# Patient Record
Sex: Female | Born: 1975 | Race: Black or African American | Hispanic: No | Marital: Single | Smoking: Current every day smoker
Health system: Southern US, Community
[De-identification: ages and names within clinical notes are randomized; demographics above are authoritative.]

## PROBLEM LIST (undated history)

## (undated) DIAGNOSIS — R102 Pelvic and perineal pain: Secondary | ICD-10-CM

## (undated) DIAGNOSIS — N946 Dysmenorrhea, unspecified: Secondary | ICD-10-CM

## (undated) DIAGNOSIS — F419 Anxiety disorder, unspecified: Secondary | ICD-10-CM

## (undated) DIAGNOSIS — D219 Benign neoplasm of connective and other soft tissue, unspecified: Secondary | ICD-10-CM

## (undated) HISTORY — PX: HERNIA REPAIR: SHX51

## (undated) HISTORY — DX: Pelvic and perineal pain: R10.2

## (undated) HISTORY — DX: Dysmenorrhea, unspecified: N94.6

## (undated) HISTORY — DX: Benign neoplasm of connective and other soft tissue, unspecified: D21.9

## (undated) HISTORY — DX: Anxiety disorder, unspecified: F41.9

## (undated) HISTORY — PX: TUBAL LIGATION: SHX77

---

## 2004-03-27 ENCOUNTER — Emergency Department: Payer: Self-pay | Admitting: Emergency Medicine

## 2004-06-12 ENCOUNTER — Emergency Department: Payer: Self-pay | Admitting: Emergency Medicine

## 2005-01-14 ENCOUNTER — Emergency Department: Payer: Self-pay | Admitting: Internal Medicine

## 2005-01-25 ENCOUNTER — Emergency Department: Payer: Self-pay | Admitting: Emergency Medicine

## 2005-06-24 ENCOUNTER — Emergency Department: Payer: Self-pay | Admitting: Emergency Medicine

## 2006-08-02 ENCOUNTER — Emergency Department: Payer: Self-pay | Admitting: General Practice

## 2006-10-03 ENCOUNTER — Inpatient Hospital Stay: Payer: Self-pay | Admitting: Vascular Surgery

## 2007-10-04 ENCOUNTER — Ambulatory Visit: Payer: Self-pay | Admitting: Certified Nurse Midwife

## 2007-12-08 ENCOUNTER — Emergency Department: Payer: Self-pay | Admitting: Emergency Medicine

## 2008-07-03 ENCOUNTER — Emergency Department: Payer: Self-pay | Admitting: Emergency Medicine

## 2008-09-04 ENCOUNTER — Ambulatory Visit: Payer: Self-pay

## 2008-09-11 ENCOUNTER — Ambulatory Visit: Payer: Self-pay

## 2008-09-22 ENCOUNTER — Emergency Department: Payer: Self-pay | Admitting: Unknown Physician Specialty

## 2009-04-19 ENCOUNTER — Emergency Department: Payer: Self-pay | Admitting: Unknown Physician Specialty

## 2010-01-17 ENCOUNTER — Emergency Department: Payer: Self-pay | Admitting: Emergency Medicine

## 2010-04-19 ENCOUNTER — Emergency Department: Payer: Self-pay | Admitting: Emergency Medicine

## 2011-03-26 ENCOUNTER — Emergency Department: Payer: Self-pay | Admitting: Emergency Medicine

## 2011-04-01 ENCOUNTER — Emergency Department: Payer: Self-pay | Admitting: Emergency Medicine

## 2011-06-10 ENCOUNTER — Emergency Department: Payer: Self-pay | Admitting: Emergency Medicine

## 2011-11-21 ENCOUNTER — Emergency Department: Payer: Self-pay | Admitting: *Deleted

## 2012-01-12 ENCOUNTER — Emergency Department: Payer: Self-pay | Admitting: Emergency Medicine

## 2012-01-26 ENCOUNTER — Ambulatory Visit: Payer: Self-pay | Admitting: Internal Medicine

## 2012-07-24 ENCOUNTER — Emergency Department: Payer: Self-pay | Admitting: Emergency Medicine

## 2012-07-24 LAB — URINALYSIS, COMPLETE
Bilirubin,UR: NEGATIVE
Blood: NEGATIVE
Nitrite: NEGATIVE
Protein: NEGATIVE
RBC,UR: 1 /HPF (ref 0–5)
Specific Gravity: 1.024 (ref 1.003–1.030)
WBC UR: 4 /HPF (ref 0–5)

## 2012-07-24 LAB — COMPREHENSIVE METABOLIC PANEL
Albumin: 3.7 g/dL (ref 3.4–5.0)
Anion Gap: 3 — ABNORMAL LOW (ref 7–16)
BUN: 11 mg/dL (ref 7–18)
Bilirubin,Total: 0.3 mg/dL (ref 0.2–1.0)
Chloride: 110 mmol/L — ABNORMAL HIGH (ref 98–107)
Co2: 25 mmol/L (ref 21–32)
Creatinine: 0.76 mg/dL (ref 0.60–1.30)
EGFR (Non-African Amer.): >60
Glucose: 93 mg/dL (ref 65–99)
SGOT(AST): 13 U/L — ABNORMAL LOW (ref 15–37)
Sodium: 138 mmol/L (ref 136–145)
Total Protein: 7.3 g/dL (ref 6.4–8.2)

## 2012-07-24 LAB — CBC
HCT: 39.5 % (ref 35.0–47.0)
MCH: 26 pg (ref 26.0–34.0)
MCV: 78 fL — ABNORMAL LOW (ref 80–100)
RBC: 5.04 10*6/uL (ref 3.80–5.20)
RDW: 14.5 % (ref 11.5–14.5)
WBC: 8.4 10*3/uL (ref 3.6–11.0)

## 2012-07-24 LAB — LIPASE, BLOOD: Lipase: 291 U/L (ref 73–393)

## 2012-07-24 LAB — WET PREP, GENITAL

## 2012-11-01 ENCOUNTER — Emergency Department: Payer: Self-pay | Admitting: Internal Medicine

## 2012-11-01 LAB — COMPREHENSIVE METABOLIC PANEL
Albumin: 3.9 g/dL (ref 3.4–5.0)
Alkaline Phosphatase: 72 U/L (ref 50–136)
Anion Gap: 6 — ABNORMAL LOW (ref 7–16)
BUN: 9 mg/dL (ref 7–18)
Bilirubin,Total: 0.4 mg/dL (ref 0.2–1.0)
Creatinine: 0.78 mg/dL (ref 0.60–1.30)
Osmolality: 274 (ref 275–301)
Potassium: 3.6 mmol/L (ref 3.5–5.1)
SGOT(AST): 18 U/L (ref 15–37)
SGPT (ALT): 21 U/L (ref 12–78)
Sodium: 138 mmol/L (ref 136–145)
Total Protein: 8 g/dL (ref 6.4–8.2)

## 2012-11-01 LAB — LIPASE, BLOOD: Lipase: 240 U/L (ref 73–393)

## 2012-11-01 LAB — CBC
HCT: 39.5 % (ref 35.0–47.0)
HGB: 13.6 g/dL (ref 12.0–16.0)
MCH: 26.4 pg (ref 26.0–34.0)
MCHC: 34.3 g/dL (ref 32.0–36.0)
MCV: 77 fL — ABNORMAL LOW (ref 80–100)
Platelet: 306 10*3/uL (ref 150–440)
RDW: 14 % (ref 11.5–14.5)
WBC: 7.1 10*3/uL (ref 3.6–11.0)

## 2012-11-01 LAB — URINALYSIS, COMPLETE
Bilirubin,UR: NEGATIVE
Ketone: NEGATIVE
Nitrite: NEGATIVE
RBC,UR: 2 /HPF (ref 0–5)
Specific Gravity: 1.021 (ref 1.003–1.030)
Squamous Epithelial: 26

## 2013-01-02 ENCOUNTER — Emergency Department: Payer: Self-pay | Admitting: Emergency Medicine

## 2013-03-27 ENCOUNTER — Emergency Department: Payer: Self-pay | Admitting: Emergency Medicine

## 2013-03-29 LAB — BETA STREP CULTURE(ARMC)

## 2013-08-09 ENCOUNTER — Emergency Department: Payer: Self-pay

## 2013-11-08 ENCOUNTER — Emergency Department: Payer: Self-pay | Admitting: Emergency Medicine

## 2013-11-08 LAB — URINALYSIS, COMPLETE
BILIRUBIN, UR: NEGATIVE
Bacteria: NONE SEEN
Blood: NEGATIVE
GLUCOSE, UR: NEGATIVE mg/dL (ref 0–75)
Ketone: NEGATIVE
LEUKOCYTE ESTERASE: NEGATIVE
Nitrite: NEGATIVE
Ph: 8 (ref 4.5–8.0)
Protein: NEGATIVE
RBC,UR: 1 /HPF (ref 0–5)
Specific Gravity: 1.013 (ref 1.003–1.030)
WBC UR: 2 /HPF (ref 0–5)

## 2014-05-12 ENCOUNTER — Emergency Department: Payer: Self-pay | Admitting: Emergency Medicine

## 2014-10-24 ENCOUNTER — Encounter: Payer: Self-pay | Admitting: Emergency Medicine

## 2014-10-24 ENCOUNTER — Emergency Department
Admission: EM | Admit: 2014-10-24 | Discharge: 2014-10-24 | Disposition: A | Discharge status: Home / Self Care | Payer: BLUE CROSS/BLUE SHIELD | Attending: Emergency Medicine | Admitting: Emergency Medicine

## 2014-10-24 DIAGNOSIS — M25531 Pain in right wrist: Secondary | ICD-10-CM | POA: Diagnosis not present

## 2014-10-24 DIAGNOSIS — Z720 Tobacco use: Secondary | ICD-10-CM | POA: Diagnosis not present

## 2014-10-24 MED ORDER — NAPROXEN 500 MG PO TABS: 500.0000 mg | ORAL_TABLET | Freq: Two times a day (BID) | ORAL | Status: DC

## 2014-10-24 MED ORDER — PREDNISONE 50 MG PO TABS: 50.0000 mg | ORAL_TABLET | Freq: Every day | ORAL | Status: DC

## 2014-10-24 NOTE — Discharge Instructions (Signed)

## 2014-10-24 NOTE — ED Notes (Signed)
Pt here with c/o right wrist pain, states "it looks like I got a little knot right there"; pt reports pain to top of hand, reports shooting pain up and down arm; some swelling note to forearm; denies recent injury.

## 2014-10-24 NOTE — ED Provider Notes (Signed)
[facility] Emergency Department Provider Note  ____________________________________________  Time seen: On arrival  I have reviewed the triage vital signs and the nursing notes.   HISTORY  Chief Complaint Wrist Pain    HPI Natasha Wolfe is a 39 y.o. female who presents with complaints of right wrist pain.She reports she woke with this pain. No injuries reported. No fevers no chills. No redness. 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 No numbness or tingling in her fingers. She reports she works with her hands at work is right-handed. She reports the pain is moderate to severe with movement of her right hand.    History reviewed. No pertinent past medical history.  There are no active problems to display for this patient.   Past Surgical History  Procedure Laterality Date  . Tubal ligation    . Hernia repair      No current outpatient prescriptions on file.  Allergies Review of patient's allergies indicates no known allergies.  No family history on file.  Social History History  Substance Use Topics  . Smoking status: Current Every Day Smoker -- 0.50 packs/day    Types: Cigarettes  . Smokeless tobacco: Not on file  . Alcohol Use: No    Review of Systems  Constitutional: Negative for fever. Eyes: Negative for visual changes. ENT: Negative for sore throat   Genitourinary: Negative for dysuria. Musculoskeletal: Negative for back pain. Skin: Negative for rash. Neurological: Negative for headaches or focal weakness   ____________________________________________   PHYSICAL EXAM:  VITAL SIGNS: ED Triage Vitals  Enc Vitals Group     BP 10/24/14 1126 121/80 mmHg     Pulse Rate 10/24/14 1126 82     Resp 10/24/14 1126 16     Temp 10/24/14 1126 98.2 F (36.8 C)     Temp Source 10/24/14 1126 Oral     SpO2 10/24/14 1126 100 %     Weight  10/24/14 1126 202 lb (91.627 kg)     Height 10/24/14 1126 5\' 3"  (1.6 m)     Head Cir --      Peak Flow --      Pain Score 10/24/14 1128 10     Pain Loc --      Pain Edu? --      Excl. in [place]? --      Constitutional: Alert and oriented. Well appearing and in no distress. Eyes: Conjunctivae are normal.  ENT   Head: Normocephalic and atraumatic.   Mouth/Throat: Mucous membranes are moist. Cardiovascular: Normal rate, regular rhythm.  Respiratory: Normal respiratory effort without tachypnea nor retractions.  Gastrointestinal: Soft and non-tender in all quadrants. No distention. There is no CVA tenderness. Musculoskeletal: All external and is normal except right wrist with mild swelling, no erythema. Pain with active or passive range of motion. Normal cap refill distally. Normal pulses. No evidence of infection. Neurologic:  Normal speech and language. No gross focal neurologic deficits are appreciated. Skin:  Skin is warm, dry and intact. No rash noted. Psychiatric: Mood and affect are normal. Patient exhibits appropriate insight and judgment.  ____________________________________________    LABS (pertinent positives/negatives)  Labs Reviewed - No data to display  ____________________________________________     ____________________________________________    RADIOLOGY I have personally reviewed any xrays that were ordered on this patient: None  ____________________________________________   PROCEDURES  Procedure(s) performed: none  ____________________________________________   INITIAL IMPRESSION / ASSESSMENT AND PLAN / ED COURSE  Pertinent labs & imaging results that were available during my care of the patient were reviewed by me and considered in my medical decision making (see chart for details).  Differential diagnosis includes arthritis, rheumatoid arthritis, arthralgia, gout. We will treat with NSAIDs brief course of prednisone and have patient  follow-up with orthopedist. She may need to consider rheumatology if this does not improve.  ____________________________________________   FINAL CLINICAL IMPRESSION(S) / ED DIAGNOSES  Final diagnoses:  Wrist pain, acute, right      Lavonia Drafts, MD 10/24/14 1142

## 2015-08-10 ENCOUNTER — Emergency Department
Admission: EM | Admit: 2015-08-10 | Discharge: 2015-08-10 | Disposition: A | Discharge status: Home / Self Care | Payer: BLUE CROSS/BLUE SHIELD | Attending: Emergency Medicine | Admitting: Emergency Medicine

## 2015-08-10 ENCOUNTER — Emergency Department: Payer: BLUE CROSS/BLUE SHIELD

## 2015-08-10 DIAGNOSIS — S60021A Contusion of right index finger without damage to nail, initial encounter: Secondary | ICD-10-CM | POA: Diagnosis not present

## 2015-08-10 DIAGNOSIS — Y999 Unspecified external cause status: Secondary | ICD-10-CM | POA: Diagnosis not present

## 2015-08-10 DIAGNOSIS — S6991XA Unspecified injury of right wrist, hand and finger(s), initial encounter: Secondary | ICD-10-CM | POA: Diagnosis present

## 2015-08-10 DIAGNOSIS — W230XXA Caught, crushed, jammed, or pinched between moving objects, initial encounter: Secondary | ICD-10-CM | POA: Insufficient documentation

## 2015-08-10 DIAGNOSIS — Y939 Activity, unspecified: Secondary | ICD-10-CM | POA: Diagnosis not present

## 2015-08-10 DIAGNOSIS — F17210 Nicotine dependence, cigarettes, uncomplicated: Secondary | ICD-10-CM | POA: Insufficient documentation

## 2015-08-10 DIAGNOSIS — S6000XA Contusion of unspecified finger without damage to nail, initial encounter: Secondary | ICD-10-CM

## 2015-08-10 DIAGNOSIS — Y929 Unspecified place or not applicable: Secondary | ICD-10-CM | POA: Insufficient documentation

## 2015-08-10 MED ORDER — HYDROCODONE-ACETAMINOPHEN 5-325 MG PO TABS: 1.0000 | ORAL_TABLET | ORAL | Status: DC | PRN

## 2015-08-10 MED ORDER — IBUPROFEN 800 MG PO TABS: 800.0000 mg | ORAL_TABLET | Freq: Three times a day (TID) | ORAL | Status: DC | PRN

## 2015-08-10 NOTE — ED Notes (Signed)
Right index finger swollen and tender states she shut in door this am

## 2015-08-10 NOTE — ED Notes (Signed)
Pt reports shutting right index finger in car door today.

## 2015-08-10 NOTE — Discharge Instructions (Signed)
Cryotherapy °Cryotherapy means treatment with cold. Ice or gel packs can be used to reduce both pain and swelling. Ice is the most helpful within the first 24 to 48 hours after an injury or flare-up from overusing a muscle or joint. Sprains, strains, spasms, burning pain, shooting pain, and aches can all be eased with ice. Ice can also be used when recovering from surgery. Ice is effective, has very few side effects, and is safe for most people to use. °PRECAUTIONS  °Ice is not a safe treatment option for people with: °· Raynaud phenomenon. This is a condition affecting small blood vessels in the extremities. Exposure to cold may cause your problems to return. °· Cold hypersensitivity. There are many forms of cold hypersensitivity, including: °· Cold urticaria. Red, itchy hives appear on the skin when the tissues begin to warm after being iced. °· Cold erythema. This is a red, itchy rash caused by exposure to cold. °· Cold hemoglobinuria. Red blood cells break down when the tissues begin to warm after being iced. The hemoglobin that carry oxygen are passed into the urine because they cannot combine with blood proteins fast enough. °· Numbness or altered sensitivity in the area being iced. °If you have any of the following conditions, do not use ice until you have discussed cryotherapy with your caregiver: °· Heart conditions, such as arrhythmia, angina, or chronic heart disease. °· High blood pressure. °· Healing wounds or open skin in the area being iced. °· Current infections. °· Rheumatoid arthritis. °· Poor circulation. °· Diabetes. °Ice slows the blood flow in the region it is applied. This is beneficial when trying to stop inflamed tissues from spreading irritating chemicals to surrounding tissues. However, if you expose your skin to cold temperatures for too long or without the proper protection, you can damage your skin or nerves. Watch for signs of skin damage due to cold. °HOME CARE INSTRUCTIONS °Follow  these tips to use ice and cold packs safely. °· Place a dry or damp towel between the ice and skin. A damp towel will cool the skin more quickly, so you may need to shorten the time that the ice is used. °· For a more rapid response, add gentle compression to the ice. °· Ice for no more than 10 to 20 minutes at a time. The bonier the area you are icing, the less time it will take to get the benefits of ice. °· Check your skin after 5 minutes to make sure there are no signs of a poor response to cold or skin damage. °· Rest 20 minutes or more between uses. °· Once your skin is numb, you can end your treatment. You can test numbness by very lightly touching your skin. The touch should be so light that you do not see the skin dimple from the pressure of your fingertip. When using ice, most people will feel these normal sensations in this order: cold, burning, aching, and numbness. °· Do not use ice on someone who cannot communicate their responses to pain, such as small children or people with dementia. °HOW TO MAKE AN ICE PACK °Ice packs are the most common way to use ice therapy. Other methods include ice massage, ice baths, and cryosprays. Muscle creams that cause a cold, tingly feeling do not offer the same benefits that ice offers and should not be used as a substitute unless recommended by your caregiver. °To make an ice pack, do one of the following: °· Place crushed ice or a   bag of frozen vegetables in a sealable plastic bag. Squeeze out the excess air. Place this bag inside another plastic bag. Slide the bag into a pillowcase or place a damp towel between your skin and the bag.  Mix 3 parts water with 1 part rubbing alcohol. Freeze the mixture in a sealable plastic bag. When you remove the mixture from the freezer, it will be slushy. Squeeze out the excess air. Place this bag inside another plastic bag. Slide the bag into a pillowcase or place a damp towel between your skin and the bag. SEEK MEDICAL CARE  IF:  You develop white spots on your skin. This may give the skin a blotchy (mottled) appearance.  Your skin turns blue or pale.  Your skin becomes waxy or hard.  Your swelling gets worse. MAKE SURE YOU:   Understand these instructions.  Will watch your condition.  Will get help right away if you are not doing well or get worse.   This information is not intended to replace advice given to you by your health care provider. Make sure you discuss any questions you have with your health care provider.   Document Released: 11/22/2010 Document Revised: 04/18/2014 Document Reviewed: 11/22/2010 Elsevier Interactive Patient Education 2016 [place] A contusion is a deep bruise. Contusions are the result of a blunt injury to tissues and muscle fibers under the skin. The injury causes bleeding under the skin. The skin overlying the contusion may turn blue, purple, or yellow. Minor injuries will give you a painless contusion, but more severe contusions may stay painful and swollen for a few weeks.  CAUSES  This condition is usually caused by a blow, trauma, or direct force to an area of the body. SYMPTOMS  Symptoms of this condition include:  Swelling of the injured area.  Pain and tenderness in the injured area.  Discoloration. The area may have redness and then turn blue, purple, or yellow. DIAGNOSIS  This condition is diagnosed based on a physical exam and medical history. An X-ray, CT scan, or MRI may be needed to determine if there are any associated injuries, such as broken bones (fractures). TREATMENT  Specific treatment for this condition depends on what area of the body was injured. In general, the best treatment for a contusion is resting, icing, applying pressure to (compression), and elevating the injured area. This is often called the RICE strategy. Over-the-counter anti-inflammatory medicines may also be recommended for pain control.  HOME CARE INSTRUCTIONS     Rest the injured area.  If directed, apply ice to the injured area:  Put ice in a plastic bag.  Place a towel between your skin and the bag.  Leave the ice on for 20 minutes, 2-3 times per day.  If directed, apply light compression to the injured area using an elastic bandage. Make sure the bandage is not wrapped too tightly. Remove and reapply the bandage as directed by your health care provider.  If possible, raise (elevate) the injured area above the level of your heart while you are sitting or lying down.  Take over-the-counter and prescription medicines only as told by your health care provider. SEEK MEDICAL CARE IF:  Your symptoms do not improve after several days of treatment.  Your symptoms get worse.  You have difficulty moving the injured area. SEEK IMMEDIATE MEDICAL CARE IF:   You have severe pain.  You have numbness in a hand or foot.  Your hand or foot turns pale or cold.  This information is not intended to replace advice given to you by your health care provider. Make sure you discuss any questions you have with your health care provider. °  °Document Released: 01/05/2005 Document Revised: 12/17/2014 Document Reviewed: 08/13/2014 °Elsevier Interactive Patient Education ©2016 Elsevier Inc. ° °

## 2015-08-10 NOTE — ED Provider Notes (Signed)
[facility] Emergency Department Provider Note  ____________________________________________  Time seen: Approximately 10:29 AM  I have reviewed the triage vital signs and the nursing notes.   HISTORY  Chief Complaint Finger Injury    HPI Natasha Wolfe is a 40 y.o. female presents for evaluation of shutting her right index finger in a car door today. Complains of pain at the PIP joint.   No past medical history on file.  There are no active problems to display for this patient.   Past Surgical History  Procedure Laterality Date  . Tubal ligation    . Hernia repair      Current Outpatient Rx  Name  Route  Sig  Dispense  Refill  . HYDROcodone-acetaminophen (NORCO) 5-325 MG tablet   Oral   Take 1-2 tablets by mouth every 4 (four) hours as needed for moderate pain.   15 tablet   0   . ibuprofen (ADVIL,MOTRIN) 800 MG tablet   Oral   Take 1 tablet (800 mg total) by mouth every 8 (eight) hours as needed.   30 tablet   0     Allergies Review of patient's allergies indicates no known allergies.  No family history on file.  Social History Social History  Substance Use Topics  . Smoking status: Current Every Day Smoker -- 0.50 packs/day    Types: Cigarettes  . Smokeless tobacco: Not on file  . Alcohol Use: No    Review of Systems Constitutional: No fever/chills Eyes: No visual changes. ENT: No sore throat. Cardiovascular: Denies chest pain. Respiratory: Denies shortness of breath. Gastrointestinal: No abdominal pain.  No nausea, no vomiting.  No diarrhea.  No constipation. Genitourinary: Negative for dysuria. Musculoskeletal: Right index finger pain. Skin: Negative for rash. Neurological: Negative for headaches, focal weakness or numbness.  10-point ROS otherwise negative.  ____________________________________________   PHYSICAL EXAM:  VITAL SIGNS: ED Triage Vitals  Enc Vitals Group     BP 08/10/15 1008 126/88 mmHg   Pulse Rate 08/10/15 1008 78     Resp 08/10/15 1008 16     Temp 08/10/15 1008 98.4 F (36.9 C)     Temp Source 08/10/15 1008 Oral     SpO2 08/10/15 1008 100 %     Weight 08/10/15 1008 210 lb (95.255 kg)     Height 08/10/15 1008 5\' 4"  (1.626 m)     Head Cir --      Peak Flow --      Pain Score 08/10/15 1009 10     Pain Loc --      Pain Edu? --      Excl. in [place]? --     Constitutional: Alert and oriented. Well appearing and in no acute distress. Musculoskeletal: Positive right index finger pain with limited range of motion. Increased pain and swelling and tenderness at the PIP joint. Distally neurovascularly intact skin intact. Neurologic:  Normal speech and language. No gross focal neurologic deficits are appreciated. No gait instability. Skin:  Skin is warm, dry and intact. No rash noted. Psychiatric: Mood and affect are normal. Speech and behavior are normal.  ____________________________________________   LABS (all labs ordered are listed, but only abnormal results are displayed)  Labs Reviewed - No data to display ____________________________________________  RADIOLOGY  IMPRESSION: No acute abnormality. Old tiny avulsion from the volar plate of the base of the middle phalanx. ____________________________________________   PROCEDURES  Procedure(s) performed: None  Critical Care performed: No  ____________________________________________   INITIAL IMPRESSION / ASSESSMENT  AND PLAN / ED COURSE  Pertinent labs & imaging results that were available during my care of the patient were reviewed by me and considered in my medical decision making (see chart for details).  Acute finger contusion. Patient placed with a [facility] Rx given for ibuprofen and Vicodin as needed for severe pain. Patient follow-up with PCP or return to ER with any worsening symptomology. ____________________________________________   FINAL CLINICAL IMPRESSION(S) / ED DIAGNOSES  Final  diagnoses:  Finger contusion, initial encounter     This chart was dictated using voice recognition software/Dragon. Despite best efforts to proofread, errors can occur which can change the meaning. Any change was purely unintentional.   Arlyss Repress, PA-C 08/10/15 [place], MD 08/10/15 [phone]

## 2015-09-14 ENCOUNTER — Emergency Department
Admission: EM | Admit: 2015-09-14 | Discharge: 2015-09-14 | Disposition: A | Discharge status: Home / Self Care | Payer: BLUE CROSS/BLUE SHIELD | Attending: Emergency Medicine | Admitting: Emergency Medicine

## 2015-09-14 ENCOUNTER — Emergency Department: Payer: BLUE CROSS/BLUE SHIELD

## 2015-09-14 ENCOUNTER — Encounter: Payer: Self-pay | Admitting: Emergency Medicine

## 2015-09-14 DIAGNOSIS — J40 Bronchitis, not specified as acute or chronic: Secondary | ICD-10-CM | POA: Diagnosis not present

## 2015-09-14 DIAGNOSIS — Z79899 Other long term (current) drug therapy: Secondary | ICD-10-CM | POA: Insufficient documentation

## 2015-09-14 DIAGNOSIS — R110 Nausea: Secondary | ICD-10-CM

## 2015-09-14 DIAGNOSIS — Z791 Long term (current) use of non-steroidal anti-inflammatories (NSAID): Secondary | ICD-10-CM | POA: Diagnosis not present

## 2015-09-14 DIAGNOSIS — F17210 Nicotine dependence, cigarettes, uncomplicated: Secondary | ICD-10-CM | POA: Diagnosis not present

## 2015-09-14 LAB — COMPREHENSIVE METABOLIC PANEL
ALBUMIN: 4.2 g/dL (ref 3.5–5.0)
ALK PHOS: 67 U/L (ref 38–126)
ALT: 13 U/L — ABNORMAL LOW (ref 14–54)
ANION GAP: 8 (ref 5–15)
AST: 17 U/L (ref 15–41)
BUN: 8 mg/dL (ref 6–20)
CALCIUM: 9.2 mg/dL (ref 8.9–10.3)
CHLORIDE: 106 mmol/L (ref 101–111)
CO2: 24 mmol/L (ref 22–32)
Creatinine, Ser: 0.85 mg/dL (ref 0.44–1.00)
GFR calc Af Amer: >60 mL/min (ref >60)
GFR calc non Af Amer: >60 mL/min (ref >60)
GLUCOSE: 90 mg/dL (ref 65–99)
Potassium: 3.6 mmol/L (ref 3.5–5.1)
SODIUM: 138 mmol/L (ref 135–145)
Total Bilirubin: 0.5 mg/dL (ref 0.3–1.2)
Total Protein: 7.8 g/dL (ref 6.5–8.1)

## 2015-09-14 LAB — URINALYSIS COMPLETE WITH MICROSCOPIC (ARMC ONLY)
BILIRUBIN URINE: NEGATIVE
Bacteria, UA: NONE SEEN
GLUCOSE, UA: NEGATIVE mg/dL
HGB URINE DIPSTICK: NEGATIVE
KETONES UR: NEGATIVE mg/dL
LEUKOCYTES UA: NEGATIVE
NITRITE: NEGATIVE
Protein, ur: NEGATIVE mg/dL
RBC / HPF: NONE SEEN RBC/hpf (ref 0–5)
SPECIFIC GRAVITY, URINE: 1.017 (ref 1.005–1.030)
pH: 7 (ref 5.0–8.0)

## 2015-09-14 LAB — CBC
HCT: 39.2 % (ref 35.0–47.0)
Hemoglobin: 12.9 g/dL (ref 12.0–16.0)
MCH: 24.9 pg — ABNORMAL LOW (ref 26.0–34.0)
MCHC: 32.9 g/dL (ref 32.0–36.0)
MCV: 75.8 fL — ABNORMAL LOW (ref 80.0–100.0)
PLATELETS: 363 10*3/uL (ref 150–440)
RBC: 5.17 MIL/uL (ref 3.80–5.20)
RDW: 15 % — AB (ref 11.5–14.5)
WBC: 7.1 10*3/uL (ref 3.6–11.0)

## 2015-09-14 LAB — LIPASE, BLOOD: LIPASE: 37 U/L (ref 11–51)

## 2015-09-14 MED ORDER — PROMETHAZINE HCL 25 MG PO TABS: 25.0000 mg | ORAL_TABLET | Freq: Four times a day (QID) | ORAL | Status: DC | PRN

## 2015-09-14 MED ORDER — AZITHROMYCIN 250 MG PO TABS
ORAL_TABLET | ORAL | Status: AC
  Administered 2015-09-14: 500 mg via ORAL
  Filled 2015-09-14: qty 2

## 2015-09-14 MED ORDER — AZITHROMYCIN 250 MG PO TABS
500.0000 mg | ORAL_TABLET | Freq: Once | ORAL | Status: AC
  Administered 2015-09-14: 500 mg via ORAL

## 2015-09-14 MED ORDER — SODIUM CHLORIDE 0.9 % IV BOLUS (SEPSIS)
1000.0000 mL | Freq: Once | INTRAVENOUS | Status: AC
  Administered 2015-09-14: 1000 mL via INTRAVENOUS

## 2015-09-14 MED ORDER — ONDANSETRON HCL 4 MG/2ML IJ SOLN
4.0000 mg | Freq: Once | INTRAMUSCULAR | Status: AC | PRN
  Administered 2015-09-14: 4 mg via INTRAVENOUS
  Filled 2015-09-14: qty 2

## 2015-09-14 MED ORDER — AZITHROMYCIN 250 MG PO TABS: 250.0000 mg | ORAL_TABLET | Freq: Every day | ORAL | Status: DC

## 2015-09-14 MED ORDER — AZITHROMYCIN 250 MG PO TABS
ORAL_TABLET | ORAL | Status: AC
  Filled 2015-09-14: qty 1

## 2015-09-14 NOTE — ED Provider Notes (Signed)
[facility] Emergency Department Provider Note  Time seen: 4:33 PM  I have reviewed the triage vital signs and the nursing notes.   HISTORY  Chief Complaint Emesis    HPI Natasha Wolfe is a 40 y.o. female with no past medical history who presents to the emergency department with nausea, generalized weakness and cough. According to the patient for the past one week she has been experiencing sinus congestion along with his significant cough. She was prescribed an antibiotic last week by the nurse at her work, however the patient states she cannot afford to fill it as it costs $50. She states the sinus congestion has improved she continues to have a moderate cough. She took sinus/cold medication last night and since then she states she has been feeling nauseated with occasional lightheaded sensation. Denies any abdominal pain. Denies any chest pain. Denies any headache or fever. Describes her nausea is moderate, much improved after receiving Zofran in the emergency department.     History reviewed. No pertinent past medical history.  There are no active problems to display for this patient.   Past Surgical History  Procedure Laterality Date  . Tubal ligation    . Hernia repair      Current Outpatient Rx  Name  Route  Sig  Dispense  Refill  . HYDROcodone-acetaminophen (NORCO) 5-325 MG tablet   Oral   Take 1-2 tablets by mouth every 4 (four) hours as needed for moderate pain.   15 tablet   0   . ibuprofen (ADVIL,MOTRIN) 800 MG tablet   Oral   Take 1 tablet (800 mg total) by mouth every 8 (eight) hours as needed.   30 tablet   0     Allergies Review of patient's allergies indicates no known allergies.  No family history on file.  Social History Social History  Substance Use Topics  . Smoking status: Current Every Day Smoker -- 0.50 packs/day    Types: Cigarettes  . Smokeless tobacco: None  . Alcohol Use: No    Review of  Systems Constitutional: Negative for fever.Occasional lightheadedness Cardiovascular: Negative for chest pain. Respiratory: Negative for shortness of breath. Gastrointestinal: Negative for abdominal pain. Positive for nausea. Genitourinary: Negative for dysuria. Musculoskeletal: Negative for back pain. Neurological: Negative for headache 10-point ROS otherwise negative.  ____________________________________________   PHYSICAL EXAM:  VITAL SIGNS: ED Triage Vitals  Enc Vitals Group     BP 09/14/15 1457 119/79 mmHg     Pulse Rate 09/14/15 1457 100     Resp 09/14/15 1457 18     Temp 09/14/15 1457 98.7 F (37.1 C)     Temp Source 09/14/15 1457 Oral     SpO2 09/14/15 1457 99 %     Weight 09/14/15 1457 190 lb (86.183 kg)     Height 09/14/15 1457 5\' 3"  (1.6 m)     Head Cir --      Peak Flow --      Pain Score --      Pain Loc --      Pain Edu? --      Excl. in [place]? --     Constitutional: Alert and oriented. Well appearing and in no distress. Eyes: Normal exam ENT   Head: Normocephalic and atraumatic   Mouth/Throat: Mucous membranes are moist. Cardiovascular: Normal rate, regular rhythm. No murmur Respiratory: Normal respiratory effort without tachypnea nor retractions. Breath sounds are clear  Gastrointestinal: Soft and nontender. No distention.   Musculoskeletal: Nontender with normal  range of motion in all extremities.  Neurologic:  Normal speech and language. No gross focal neurologic deficits Skin:  Skin is warm, dry and intact.  Psychiatric: Mood and affect are normal. Speech and behavior are normal.  ____________________________________________   RADIOLOGY  Bronchitic changes on chest x-ray     INITIAL IMPRESSION / ASSESSMENT AND PLAN / ED COURSE  Pertinent labs & imaging results that were available during my care of the patient were reviewed by me and considered in my medical decision making (see chart for details).  Patient presents the emergency  department with cough, nausea, intermittent lightheadedness, ongoing over the past one week but worse recently. States the nausea began last night after taking over-the-counter cold medications. Patient feeling much better after receiving nausea medication in the emergency department. Patient has a normal exam, she does have a fairly frequent dry cough, no rales or rhonchi. We will obtain a chest x-ray to rule out focal infection. Her symptoms are suggestive of bronchitis given her very frequent dry cough. States the nausea typically occurs after coughing. Patient's labs are largely within normal limits. If chest x-ray is normal we will place the patient on Zithromax.  X-ray consistent with bronchitis. We will dose Zithromax in the emergency department and discharged with the same. We'll also discharge with Phenergan as needed for nausea. Patient states she is feeling much better after IV fluids.   ____________________________________________   FINAL CLINICAL IMPRESSION(S) / ED DIAGNOSES  Bronchitis Nausea   Harvest Dark, MD 09/14/15 1757

## 2015-09-14 NOTE — ED Notes (Signed)
Preg test = NEG

## 2015-09-14 NOTE — ED Notes (Signed)
Nausea and vomiting since last night. Denies abdominal pain. States took an over the counter sinus med and began after that.

## 2015-09-14 NOTE — Discharge Instructions (Signed)
Upper Respiratory Infection, Adult Most upper respiratory infections (URIs) are a viral infection of the air passages leading to the lungs. A URI affects the nose, throat, and upper air passages. The most common type of URI is nasopharyngitis and is typically referred to as "the common cold." URIs run their course and usually go away on their own. Most of the time, a URI does not require medical attention, but sometimes a bacterial infection in the upper airways can follow a viral infection. This is called a secondary infection. Sinus and middle ear infections are common types of secondary upper respiratory infections. Bacterial pneumonia can also complicate a URI. A URI can worsen asthma and chronic obstructive pulmonary disease (COPD). Sometimes, these complications can require emergency medical care and may be life threatening.  CAUSES Almost all URIs are caused by viruses. A virus is a type of germ and can spread from one person to another.  RISKS FACTORS You may be at risk for a URI if:   You smoke.   You have chronic heart or lung disease.  You have a weakened defense (immune) system.   You are very young or very old.   You have nasal allergies or asthma.  You work in crowded or poorly ventilated areas.  You work in health care facilities or schools. SIGNS AND SYMPTOMS  Symptoms typically develop 2-3 days after you come in contact with a cold virus. Most viral URIs last 7-10 days. However, viral URIs from the influenza virus (flu virus) can last 14-18 days and are typically more severe. Symptoms may include:   Runny or stuffy (congested) nose.   Sneezing.   Cough.   Sore throat.   Headache.   Fatigue.   Fever.   Loss of appetite.   Pain in your forehead, behind your eyes, and over your cheekbones (sinus pain).  Muscle aches.  DIAGNOSIS  Your health care provider may diagnose a URI by:  Physical exam.  Tests to check that your symptoms are not due to  another condition such as:  Strep throat.  Sinusitis.  Pneumonia.  Asthma. TREATMENT  A URI goes away on its own with time. It cannot be cured with medicines, but medicines may be prescribed or recommended to relieve symptoms. Medicines may help:  Reduce your fever.  Reduce your cough.  Relieve nasal congestion. HOME CARE INSTRUCTIONS   Take medicines only as directed by your health care provider.   Gargle warm saltwater or take cough drops to comfort your throat as directed by your health care provider.  Use a warm mist humidifier or inhale steam from a shower to increase air moisture. This may make it easier to breathe.  Drink enough fluid to keep your urine clear or pale yellow.   Eat soups and other clear broths and maintain good nutrition.   Rest as needed.   Return to work when your temperature has returned to normal or as your health care provider advises. You may need to stay home longer to avoid infecting others. You can also use a face mask and careful hand washing to prevent spread of the virus.  Increase the usage of your inhaler if you have asthma.   Do not use any tobacco products, including cigarettes, chewing tobacco, or electronic cigarettes. If you need help quitting, ask your health care provider. PREVENTION  The best way to protect yourself from getting a cold is to practice good hygiene.   Avoid oral or hand contact with people with cold  symptoms.   Wash your hands often if contact occurs.  There is no clear evidence that vitamin C, vitamin E, echinacea, or exercise reduces the chance of developing a cold. However, it is always recommended to get plenty of rest, exercise, and practice good nutrition.  SEEK MEDICAL CARE IF:   You are getting worse rather than better.   Your symptoms are not controlled by medicine.   You have chills.  You have worsening shortness of breath.  You have brown or red mucus.  You have yellow or brown nasal  discharge.  You have pain in your face, especially when you bend forward.  You have a fever.  You have swollen neck glands.  You have pain while swallowing.  You have white areas in the back of your throat. SEEK IMMEDIATE MEDICAL CARE IF:   You have severe or persistent:  Headache.  Ear pain.  Sinus pain.  Chest pain.  You have chronic lung disease and any of the following:  Wheezing.  Prolonged cough.  Coughing up blood.  A change in your usual mucus.  You have a stiff neck.  You have changes in your:  Vision.  Hearing.  Thinking.  Mood. MAKE SURE YOU:   Understand these instructions.  Will watch your condition.  Will get help right away if you are not doing well or get worse.   This information is not intended to replace advice given to you by your health care provider. Make sure you discuss any questions you have with your health care provider.   Document Released: 09/21/2000 Document Revised: 08/12/2014 Document Reviewed: 07/03/2013 Elsevier Interactive Patient Education 2016 Else[employer]  Nausea, Adult Nausea is the feeling that you have an upset stomach or have to vomit. Nausea by itself is not likely a serious concern, but it may be an early sign of more serious medical problems. As nausea gets worse, it can lead to vomiting. If vomiting develops, there is the risk of dehydration.  CAUSES   Viral infections.  Food poisoning.  Medicines.  Pregnancy.  Motion sickness.  Migraine headaches.  Emotional distress.  Severe pain from any source.  Alcohol intoxication. HOME CARE INSTRUCTIONS  Get plenty of rest.  Ask your caregiver about specific rehydration instructions.  Eat small amounts of food and sip liquids more often.  Take all medicines as told by your caregiver. SEEK MEDICAL CARE IF:  You have not improved after 2 days, or you get worse.  You have a headache. SEEK IMMEDIATE MEDICAL CARE IF:   You have a  fever.  You faint.  You keep vomiting or have blood in your vomit.  You are extremely weak or dehydrated.  You have dark or bloody stools.  You have severe chest or abdominal pain. MAKE SURE YOU:  Understand these instructions.  Will watch your condition.  Will get help right away if you are not doing well or get worse.   This information is not intended to replace advice given to you by your health care provider. Make sure you discuss any questions you have with your health care provider.   Document Released: 05/05/2004 Document Revised: 04/18/2014 Document Reviewed: 12/08/2010 Elsevier Interactive Patient Education [employer].

## 2015-09-17 ENCOUNTER — Emergency Department: Payer: BLUE CROSS/BLUE SHIELD

## 2015-09-17 ENCOUNTER — Emergency Department
Admission: EM | Admit: 2015-09-17 | Discharge: 2015-09-17 | Disposition: A | Discharge status: Home / Self Care | Payer: BLUE CROSS/BLUE SHIELD | Attending: Student | Admitting: Student

## 2015-09-17 ENCOUNTER — Encounter: Payer: Self-pay | Admitting: *Deleted

## 2015-09-17 DIAGNOSIS — R1111 Vomiting without nausea: Secondary | ICD-10-CM

## 2015-09-17 DIAGNOSIS — N390 Urinary tract infection, site not specified: Secondary | ICD-10-CM | POA: Insufficient documentation

## 2015-09-17 DIAGNOSIS — Z792 Long term (current) use of antibiotics: Secondary | ICD-10-CM | POA: Diagnosis not present

## 2015-09-17 DIAGNOSIS — J40 Bronchitis, not specified as acute or chronic: Secondary | ICD-10-CM | POA: Diagnosis not present

## 2015-09-17 DIAGNOSIS — F17210 Nicotine dependence, cigarettes, uncomplicated: Secondary | ICD-10-CM | POA: Insufficient documentation

## 2015-09-17 DIAGNOSIS — R112 Nausea with vomiting, unspecified: Secondary | ICD-10-CM | POA: Diagnosis present

## 2015-09-17 LAB — CBC WITH DIFFERENTIAL/PLATELET
BASOS ABS: 0.2 10*3/uL — AB (ref 0–0.1)
BASOS PCT: 3 %
EOS ABS: 0.1 10*3/uL (ref 0–0.7)
EOS PCT: 1 %
HEMATOCRIT: 39.4 % (ref 35.0–47.0)
Hemoglobin: 13.2 g/dL (ref 12.0–16.0)
Lymphocytes Relative: 18 %
Lymphs Abs: 1.6 10*3/uL (ref 1.0–3.6)
MCH: 25 pg — ABNORMAL LOW (ref 26.0–34.0)
MCHC: 33.4 g/dL (ref 32.0–36.0)
MCV: 74.7 fL — ABNORMAL LOW (ref 80.0–100.0)
MONO ABS: 0.4 10*3/uL (ref 0.2–0.9)
Monocytes Relative: 5 %
NEUTROS PCT: 73 %
Neutro Abs: 7 10*3/uL — ABNORMAL HIGH (ref 1.4–6.5)
PLATELETS: 354 10*3/uL (ref 150–440)
RBC: 5.28 MIL/uL — ABNORMAL HIGH (ref 3.80–5.20)
RDW: 15.1 % — AB (ref 11.5–14.5)
WBC: 9.4 10*3/uL (ref 3.6–11.0)

## 2015-09-17 LAB — URINALYSIS COMPLETE WITH MICROSCOPIC (ARMC ONLY)
BILIRUBIN URINE: NEGATIVE
GLUCOSE, UA: NEGATIVE mg/dL
Ketones, ur: NEGATIVE mg/dL
NITRITE: NEGATIVE
Protein, ur: 30 mg/dL — AB
Specific Gravity, Urine: 1.028 (ref 1.005–1.030)
pH: 6 (ref 5.0–8.0)

## 2015-09-17 LAB — COMPREHENSIVE METABOLIC PANEL
ALBUMIN: 4.2 g/dL (ref 3.5–5.0)
ALT: 13 U/L — ABNORMAL LOW (ref 14–54)
ANION GAP: 8 (ref 5–15)
AST: 19 U/L (ref 15–41)
Alkaline Phosphatase: 60 U/L (ref 38–126)
BILIRUBIN TOTAL: 0.8 mg/dL (ref 0.3–1.2)
BUN: 7 mg/dL (ref 6–20)
CHLORIDE: 106 mmol/L (ref 101–111)
CO2: 24 mmol/L (ref 22–32)
Calcium: 9.2 mg/dL (ref 8.9–10.3)
Creatinine, Ser: 0.93 mg/dL (ref 0.44–1.00)
GFR calc Af Amer: >60 mL/min (ref >60)
GFR calc non Af Amer: >60 mL/min (ref >60)
GLUCOSE: 112 mg/dL — AB (ref 65–99)
POTASSIUM: 3.2 mmol/L — AB (ref 3.5–5.1)
SODIUM: 138 mmol/L (ref 135–145)
TOTAL PROTEIN: 7.9 g/dL (ref 6.5–8.1)

## 2015-09-17 LAB — LIPASE, BLOOD: Lipase: 26 U/L (ref 11–51)

## 2015-09-17 LAB — POCT PREGNANCY, URINE: Preg Test, Ur: NEGATIVE

## 2015-09-17 MED ORDER — LEVOFLOXACIN 500 MG PO TABS: 750.0000 mg | ORAL_TABLET | Freq: Every day | ORAL | Status: AC

## 2015-09-17 MED ORDER — SODIUM CHLORIDE 0.9 % IV BOLUS (SEPSIS)
1000.0000 mL | Freq: Once | INTRAVENOUS | Status: AC
  Administered 2015-09-17: 1000 mL via INTRAVENOUS

## 2015-09-17 NOTE — ED Notes (Signed)
Pt complains of nausea, vomiting, and congestion, pt was seen on 6/4 , pt reports feeling worse since last visit

## 2015-09-17 NOTE — ED Provider Notes (Signed)
[facility] Emergency Department Provider Note   ____________________________________________  Time seen: Approximately 8:42 AM  I have reviewed the triage vital signs and the nursing notes.   HISTORY  Chief Complaint Nausea and Emesis    HPI Natasha Wolfe is a 40 y.o. female with no chronic medical problems who presents for evaluation of continuation of several days' sinus congestion, cough, posttussive emesis which has been nonbloody, gradual onset, constant, no modifying factors, currently moderate. Patient was seen here on 09/14/2015 for similar symptoms, diagnosed with bronchitis, discharge with Phenergan as well as a Z-Pak, chest x-ray was unremarkable at that time. She reports that her symptoms have not improved and to her they feel "worse". She feels dehydrated. No new symptoms including no chest pain, no shortness of breath, no headache, no fever, no dizziness.    History reviewed. No pertinent past medical history.  There are no active problems to display for this patient.   Past Surgical History  Procedure Laterality Date  . Tubal ligation    . Hernia repair      Current Outpatient Rx  Name  Route  Sig  Dispense  Refill  . azithromycin (ZITHROMAX) 250 MG tablet   Oral   Take 1 tablet (250 mg total) by mouth daily.   4 each   0   . HYDROcodone-acetaminophen (NORCO) 5-325 MG tablet   Oral   Take 1-2 tablets by mouth every 4 (four) hours as needed for moderate pain.   15 tablet   0   . ibuprofen (ADVIL,MOTRIN) 800 MG tablet   Oral   Take 1 tablet (800 mg total) by mouth every 8 (eight) hours as needed.   30 tablet   0   . promethazine (PHENERGAN) 25 MG tablet   Oral   Take 1 tablet (25 mg total) by mouth every 6 (six) hours as needed for nausea or vomiting.   15 tablet   0     Allergies Review of patient's allergies indicates no known allergies.  No family history on file.  Social History Social History  Substance  Use Topics  . Smoking status: Current Every Day Smoker -- 0.50 packs/day    Types: Cigarettes  . Smokeless tobacco: None  . Alcohol Use: No    Review of Systems Constitutional: No fever/chills Eyes: No visual changes. ENT: No sore throat. Cardiovascular: Denies chest pain. Respiratory: Denies shortness of breath. Gastrointestinal: No abdominal pain.  + nausea, +post-tussive vomiting.  No diarrhea.  No constipation. Genitourinary: Negative for dysuria. Musculoskeletal: Negative for back pain. Skin: Negative for rash. Neurological: Negative for headaches, focal weakness or numbness.  10-point ROS otherwise negative.  ____________________________________________   PHYSICAL EXAM:  Filed Vitals:   09/17/15 0819 09/17/15 0900 09/17/15 1012  BP: 122/81 132/92 127/76  Pulse: 101 92 89  Temp: 98.2 F (36.8 C)    TempSrc: Oral    Resp: 20 16 20   Height: 5\' 3"  (1.6 m)    Weight: 190 lb (86.183 kg)    SpO2: 98% 98% 98%    VITAL SIGNS: ED Triage Vitals  Enc Vitals Group     BP 09/17/15 0819 122/81 mmHg     Pulse Rate 09/17/15 0819 101     Resp 09/17/15 0819 20     Temp 09/17/15 0819 98.2 F (36.8 C)     Temp Source 09/17/15 0819 Oral     SpO2 09/17/15 0819 98 %     Weight 09/17/15 0819 190 lb (86.183 kg)  Height 09/17/15 0819 5\' 3"  (1.6 m)     Head Cir --      Peak Flow --      Pain Score --      Pain Loc --      Pain Edu? --      Excl. in [place]? --     Constitutional: Alert and oriented. Well appearing and in no acute distress. Eyes: Conjunctivae are normal. PERRL. EOMI. Head: Atraumatic. Nose: No congestion/rhinnorhea. Mouth/Throat: Mucous membranes are moist.  Oropharynx non-erythematous. Neck: No stridor.   Cardiovascular: Normal rate, regular rhythm. Grossly normal heart sounds.  Good peripheral circulation. Respiratory: Normal respiratory effort.  No retractions. Lungs CTAB. Gastrointestinal: Soft and nontender. No distention.  No CVA  tenderness. Genitourinary: deferred Musculoskeletal: No lower extremity tenderness nor edema.  No joint effusions. Neurologic:  Normal speech and language. No gross focal neurologic deficits are appreciated. No gait instability. Skin:  Skin is warm, dry and intact. No rash noted. Psychiatric: Mood and affect are normal. Speech and behavior are normal.  ____________________________________________   LABS (all labs ordered are listed, but only abnormal results are displayed)  Labs Reviewed  CBC WITH DIFFERENTIAL/PLATELET - Abnormal; Notable for the following:    RBC 5.28 (*)    MCV 74.7 (*)    MCH 25.0 (*)    RDW 15.1 (*)    Neutro Abs 7.0 (*)    Basophils Absolute 0.2 (*)    All other components within normal limits  COMPREHENSIVE METABOLIC PANEL - Abnormal; Notable for the following:    Potassium 3.2 (*)    Glucose, Bld 112 (*)    ALT 13 (*)    All other components within normal limits  URINALYSIS COMPLETEWITH MICROSCOPIC ([facility] ONLY) - Abnormal; Notable for the following:    Color, Urine AMBER (*)    APPearance CLOUDY (*)    Hgb urine dipstick 1+ (*)    Protein, ur 30 (*)    Leukocytes, UA 3+ (*)    Bacteria, UA MANY (*)    Squamous Epithelial / LPF TOO NUMEROUS TO COUNT (*)    All other components within normal limits  URINE CULTURE  LIPASE, BLOOD  POC URINE PREG, ED  POCT PREGNANCY, URINE   ____________________________________________  EKG  none ____________________________________________  RADIOLOGY  CXR Normal heart size and vascularity. Lungs remain clear. No focal pneumonia, collapse or consolidation. Negative for edema, effusion or pneumothorax. Trachea is midline.  Right upper chest 8 mm nodular radiopacity projects over the upper lobe and overlapping rib shadows just inferior to the clavicle. This may represent superimposed shadows. Difficult to exclude small right upper lobe nodule.  Recommend follow-up nonemergent chest CT.  IMPRESSION: No  acute chest process.  8 mm right upper lobe nodular opacity versus nodule. See above comment and recommendation.  These results will be called to the ordering clinician or representative by the Radiologist Assistant, and communication documented in the PACS or zVision Dashboard. ____________________________________________   PROCEDURES  Procedure(s) performed: None  Critical Care performed: No  ____________________________________________   INITIAL IMPRESSION / ASSESSMENT AND PLAN / ED COURSE  Pertinent labs & imaging results that were available during my care of the patient were reviewed by me and considered in my medical decision making (see chart for details).  Natasha Wolfe is a 40 y.o. female with no chronic medical problems who presents for evaluation of continuation of several days' sinus congestion, cough, posttussive emesis which has been nonbloody in the setting of recent diagnosis of bronchitis.  On exam she is generally well-appearing and in no acute distress but mildly tachycardia on arrival however this resolved at the time of my assessment. The remainder of her vital signs are stable, she is afebrile and her exam is benign. She feels poorly, we will obtain screening labs as well as a repeat chest x-ray to rule out pneumonia, we'll give IV fluids and reassess for disposition.  ----------------------------------------- 10:50 AM on 09/17/2015 ----------------------------------------- The patient continues to appear comfortable, sitting up in bed drinking from a cup of water, tolerating by mouth intake without vomiting. She complained to me that she feels quite "jittery" but reports that she quit smoking cigarettes this week and feels that this may be the cause. Her vital signs remain stable, she still has no chest pain or difficulty breathing. I reviewed her labs. negative pregnancy test. CBC within normal limits, no leukocytosis. CMP with mild hypokalemia, potassium 3.2.  Urinalysis is concerning for urinary tract infection, 3+ leukocytes, many bacteria, 6-30 white blood cells but also several squamous cells. Urine culture sent. Chest x-ray is read as an 8 mm right upper chest nodular opacity which could represent superimposed shadows versus an actual opacity versus nodule. This was not noted on her x-ray obtained on 09/14/2015 so I doubt that this actually represents a nodule and is more likely to represent either artifact due to shadows or mild early pneumonia. She is not tachypneic, has no fever, has no leukocytosis and is not otherwise meeting criteria for sepsis. I discussed with her that we would send her home with Levaquin which would treat the urinary tract infection as well as a possible small pneumonia. We discussed return precautions, need for close PCP follow-up and she is comfortable with the discharge plan. DC home. I also provided her with coupons for Levaquin so the cost should be under $13 depending on which pharmacy she chooses. ____________________________________________   FINAL CLINICAL IMPRESSION(S) / ED DIAGNOSES  Final diagnoses:  Bronchitis  Non-intractable vomiting without nausea, vomiting of unspecified type  UTI (lower urinary tract infection)      NEW MEDICATIONS STARTED DURING THIS VISIT:  New Prescriptions   No medications on file     Note:  This document was prepared using Dragon voice recognition software and may include unintentional dictation errors.    Joanne Gavel, MD 09/17/15 1054

## 2015-09-17 NOTE — ED Notes (Signed)
Patient c/o feeling "jittery and feeling sleepy".  Patient states she is feeling anxious and scared.  Emtional support given and well received.  Will alert Dr. Edd Fabian.  Continue to monitor.

## 2015-09-17 NOTE — ED Notes (Signed)
AAOx3.  Skin warm and dry. NAD.  Ambulates with easy and steady gait.   

## 2015-09-17 NOTE — Discharge Instructions (Signed)
You were seen in the emergency department today and found to have a urinary tract infection.  There is a questionable small pneumonia on your chest x-ray though this could also represent a shadow and not be a real finding or it could be a small nodule. Either way, need to follow up with your doctor as soon as possible so that he or she can arrange a repeat x-ray of your chest or CT scan of the chest at their discretion. Return immediately to the emergency department if you develop chest pain, difficulty breathing, lightheadedness, fainting, back pain, fevers, recurrent nausea/vomiting despite taking Phenergan, or for any other concerns.

## 2015-09-18 LAB — URINE CULTURE

## 2016-09-07 ENCOUNTER — Other Ambulatory Visit: Payer: Self-pay | Admitting: Physician Assistant

## 2016-09-07 DIAGNOSIS — R1012 Left upper quadrant pain: Secondary | ICD-10-CM

## 2016-09-08 ENCOUNTER — Other Ambulatory Visit: Payer: Self-pay | Admitting: Physician Assistant

## 2016-09-08 DIAGNOSIS — R1012 Left upper quadrant pain: Secondary | ICD-10-CM

## 2016-09-09 ENCOUNTER — Ambulatory Visit
Admission: RE | Admit: 2016-09-09 | Discharge: 2016-09-09 | Disposition: A | Discharge status: Home / Self Care | Payer: BLUE CROSS/BLUE SHIELD | Source: Ambulatory Visit | Attending: Physician Assistant | Admitting: Physician Assistant

## 2016-09-09 DIAGNOSIS — D259 Leiomyoma of uterus, unspecified: Secondary | ICD-10-CM | POA: Insufficient documentation

## 2016-09-09 DIAGNOSIS — R1012 Left upper quadrant pain: Secondary | ICD-10-CM | POA: Diagnosis present

## 2016-09-23 ENCOUNTER — Telehealth: Payer: Self-pay | Admitting: Obstetrics & Gynecology

## 2016-09-23 NOTE — Telephone Encounter (Signed)
Pt is being referred by [facility] for uterine fibroids. Please schedule appt when patient calls. I have left 2 voicemail asking patient to call back. 09/21/16, 09/23/16

## 2016-09-26 NOTE — Telephone Encounter (Signed)
Pt is schedule 10/11/16

## 2016-10-11 ENCOUNTER — Ambulatory Visit: Payer: Self-pay | Admitting: Obstetrics and Gynecology

## 2016-10-31 ENCOUNTER — Encounter: Payer: Self-pay | Admitting: Obstetrics and Gynecology

## 2016-10-31 ENCOUNTER — Ambulatory Visit (INDEPENDENT_AMBULATORY_CARE_PROVIDER_SITE_OTHER): Payer: BLUE CROSS/BLUE SHIELD | Admitting: Obstetrics and Gynecology

## 2016-10-31 VITALS — BP 124/78 | Ht 63.0 in | Wt 218.0 lb

## 2016-10-31 DIAGNOSIS — A5901 Trichomonal vulvovaginitis: Secondary | ICD-10-CM

## 2016-10-31 DIAGNOSIS — D259 Leiomyoma of uterus, unspecified: Secondary | ICD-10-CM

## 2016-10-31 DIAGNOSIS — R1031 Right lower quadrant pain: Secondary | ICD-10-CM | POA: Insufficient documentation

## 2016-10-31 DIAGNOSIS — N920 Excessive and frequent menstruation with regular cycle: Secondary | ICD-10-CM | POA: Diagnosis not present

## 2016-10-31 NOTE — Progress Notes (Signed)
Obstetrics & Gynecology Office Visit   Chief Complaint  Patient presents with  . Pelvic Pain  . Menstrual Problem  . Fibroids  Referral from [facility], Dr. Elvera Bicker  History of Present Illness: 41 y.o. [id number] who presents for Heavy and painful menses. She is seen in referral at the request of Dr. Elvera Bicker at [place] [facility]. She has about an 8 year history of heavy periods. Her menses, once per month and last about 7 days. She uses an entire bag of pads per day on the heavy days. She notes the passage of large clots. She associates pain on her right lower quadrant. She has been evaluated for this in the past and 4 years ago she had a normal Pap smear, no STDs, an ultrasound that showed a 2 cm fibroid. At that time she had decided to use an IUD for symptom control.  She has not returned since that time and has used no hormonal medication for control of her symptoms.   Past Medical History:  Diagnosis Date  . Anxiety   . Dysmenorrhea   . Fibroid   . Pelvic pain     Past Surgical History:  Procedure Laterality Date  . HERNIA REPAIR - umbilical (pt states she had mesh)    . TUBAL LIGATION      Gynecologic History: Patient's last menstrual period was 10/10/2016.  Obstetric History: [id number]  Family History  Problem Relation Age of Onset  . Cancer Maternal Grandfather     Social History   Social History  . Marital status: Single    Spouse name: N/A  . Number of children: N/A  . Years of education: N/A   Occupational History  . Not on file.   Social History Main Topics  . Smoking status: Current Every Day Smoker    Packs/day: 0.50    Types: Cigarettes  . Smokeless tobacco: Never Used  . Alcohol use No  . Drug use: No  . Sexual activity: Yes    Birth control/ protection: Surgical   Other Topics Concern  . Not on file   Social History Narrative  . No narrative on file   Allergies: No Known Allergies  Prior to Admission  medications   Medication Sig Start Date End Date Taking? Authorizing Provider  [facility] LORATADINE 10 MG tablet TAKE 1 TABLET DAILY FOR ALLERGIES 08/09/16   [provider]     Review of Systems  Constitutional: Negative.   HENT: Negative.   Eyes: Negative.   Respiratory: Negative.   Cardiovascular: Negative.   Gastrointestinal: Negative.   Genitourinary: Negative.   Musculoskeletal: Negative.   Skin: Negative.   Neurological: Negative.   Psychiatric/Behavioral: Negative.      Physical Exam BP 124/78   Ht 5\' 3"  (1.6 m)   Wt 218 lb (98.9 kg)   LMP 10/10/2016   BMI 38.62 kg/m  Patient's last menstrual period was 10/10/2016. Physical Exam  Constitutional: She is oriented to person, place, and time. She appears well-developed and well-nourished. No distress.  Genitourinary: Vagina normal and uterus normal. Pelvic exam was performed with patient supine. There is no rash, tenderness or lesion on the right labia. There is no rash, tenderness or lesion on the left labia. Vagina exhibits no lesion. No erythema or bleeding in the vagina. No foreign body in the vagina. No signs of injury around the vagina.  Right adnexum displays tenderness (mild ttp). Right adnexum does not display mass and does not display fullness. Left adnexum  does not display mass, does not display tenderness and does not display fullness. Cervix does not exhibit motion tenderness, lesion, discharge or polyp.  Genitourinary Comments: Bimanual exam limited by patient body habitus  HENT:  Head: Normocephalic and atraumatic.  Eyes: EOM are normal. No scleral icterus.  Neck: Normal range of motion. Neck supple. No thyromegaly present.  Cardiovascular: Normal rate and regular rhythm.   Pulmonary/Chest: Effort normal and breath sounds normal. No respiratory distress. She has no wheezes. She has no rales.  Abdominal: Soft. Bowel sounds are normal. She exhibits no distension and no mass. There is no tenderness. There is no  rebound and no guarding.  Musculoskeletal: Normal range of motion. She exhibits no edema.  Lymphadenopathy:    She has no cervical adenopathy.  Neurological: She is alert and oriented to person, place, and time. No cranial nerve deficit.  Skin: Skin is warm and dry. No erythema.  Psychiatric: She has a normal mood and affect. Her behavior is normal. Judgment normal.   Endometrial Biopsy After discussion with the patient regarding her abnormal uterine bleeding I recommended that she proceed with an endometrial biopsy for further diagnosis. The risks, benefits, alternatives, and indications for an endometrial biopsy were discussed with the patient in detail. She understood the risks including infection, bleeding, cervical laceration and uterine perforation.  Verbal consent was obtained.   PROCEDURE NOTE:  Pipelle endometrial biopsy was performed using aseptic technique with iodine preparation.  The uterus was sounded to a length of 8 cm.  Adequate sampling was obtained with minimal blood loss.  The patient tolerated the procedure well.  Disposition will be pending pathology.  Female chaperone present for pelvic and breast  portions of the physical exam  Assessment: 41 y.o. [id number] female No problem-specific Assessment & Plan notes found for this encounter.   Plan: Problem List Items Addressed This Visit    Menorrhagia with regular cycle - Primary   Relevant Orders   IGP,CtNgTv,Apt HPV,rfx16/18,45   Pathology   Fibroid uterus   Right lower quadrant abdominal pain     Discussed management options for abnormal uterine bleeding including expectant, NSAIDs, tranexamic acid (Lysteda), oral progesterone (Provera, norethindrone, megace), Depo Provera, Levonorgestrel containing IUD, endometrial ablation (Novasure) or hysterectomy as definitive surgical management.  Discussed risks and benefits of each method.   Final management decision will hinge on results of patient's work up and whether an  underlying etiology for the patients bleeding symptoms can be discerned.  We will conduct a basic work up examining using the PALM-COIEN classification system.  She has had workup to this point revealing no structural lesions and no other non-structural etiologies likely.  Patient chooses hysterectomy.  I believe this is a reasonable option given the length of time she has had this problem (eight years).  However, I did encourage her to consider hormonal options and surgical options that were less invasive (i.e. Endometrial ablation). Will schedule surgery.   Prentice Docker, MD 10/31/2016 5:29 PM     CC: Linus Salmons, PA-C [address] [place], [place] [id number]

## 2016-11-01 ENCOUNTER — Telehealth: Payer: Self-pay | Admitting: Obstetrics and Gynecology

## 2016-11-01 NOTE — Telephone Encounter (Signed)
-----   Message from Will Bonnet, MD sent at 10/31/2016  5:33 PM EDT ----- Regarding: Schedule Surgery Surgery Booking Request Patient Full Name: Tessah Patchen   MRN: [id number]  DOB: 1975-09-16  Surgeon: Prentice Docker, MD  Requested Surgery Date and Time: next month or so (please schedule on day when another surgeon is in the OR) Primary Diagnosis and Code: menorrhagia with regular cycle, uterine fibroid, pelvic pain Secondary Diagnosis and Code: none Surgical Procedure: TLH/BS, cystoscopy L&D Notification: No Admission Status: same day surgery Length of Surgery: 2.5 hours Special Case Needs: none H&P: TBD (date) Phone Interview or Office Pre-Admit: pre-admit Interpreter: n/a Language: english Medical Clearance: none Special Scheduling Instructions: none

## 2016-11-01 NOTE — Telephone Encounter (Signed)
Patient returned call, and is scheduled for H&P at [facility] on 11/11/16 @ 9:10am w/ Dr. Glennon Mac, Pre-admit Testing at [facility] following, and OR on 11/17/16. Also, patient would like a call back with test results when they come in.

## 2016-11-01 NOTE — Telephone Encounter (Signed)
Lmtrc

## 2016-11-01 NOTE — Telephone Encounter (Signed)
-----   Message from Will Bonnet, MD sent at 10/31/2016  5:33 PM EDT ----- Regarding: Schedule Surgery Surgery Booking Request Patient Full Name: Natasha Wolfe   MRN: [id number]  DOB: [birth year]  Surgeon: Prentice Docker, MD  Requested Surgery Date and Time: next month or so (please schedule on day when another surgeon is in the OR) Primary Diagnosis and Code: menorrhagia with regular cycle, uterine fibroid, pelvic pain Secondary Diagnosis and Code: none Surgical Procedure: TLH/BS, cystoscopy L&D Notification: No Admission Status: same day surgery Length of Surgery: 2.5 hours Special Case Needs: none H&P: TBD (date) Phone Interview or Office Pre-Admit: pre-admit Interpreter: n/a Language: english Medical Clearance: none Special Scheduling Instructions: none

## 2016-11-02 LAB — PATHOLOGY

## 2016-11-03 LAB — IGP,CTNGTV,APT HPV,RFX16/18,45
CHLAMYDIA, NUC. ACID AMP: NEGATIVE
Gonococcus, Nuc. Acid Amp: NEGATIVE
HPV Aptima: NEGATIVE
PAP SMEAR COMMENT: 0
Trich vag by NAA: POSITIVE — AB

## 2016-11-03 MED ORDER — METRONIDAZOLE 500 MG PO TABS: ORAL_TABLET | ORAL | 0 refills | Status: DC

## 2016-11-03 NOTE — Addendum Note (Signed)
Addended by: Prentice Docker D on: 11/03/2016 11:05 AM   Modules accepted: Orders

## 2016-11-07 ENCOUNTER — Telehealth: Payer: Self-pay

## 2016-11-07 NOTE — Telephone Encounter (Signed)
Trying to call pt with normal bx results. No answer, LM informing her of results.

## 2016-11-07 NOTE — Telephone Encounter (Signed)
-----   Message from Will Bonnet, MD sent at 11/07/2016 12:09 PM EDT ----- Please let her endometrial biopsy was normal.  Please document a telephone encounter. I think that's the best way to be sure everyone is on the same page.

## 2016-11-07 NOTE — Telephone Encounter (Signed)
Spoke with pt last week about + trich results. Pt aware that she needs to pick up RX to treat this and also that her partner needs to be treated ASAP.

## 2016-11-11 ENCOUNTER — Ambulatory Visit (INDEPENDENT_AMBULATORY_CARE_PROVIDER_SITE_OTHER): Payer: BLUE CROSS/BLUE SHIELD | Admitting: Obstetrics and Gynecology

## 2016-11-11 ENCOUNTER — Telehealth: Payer: Self-pay

## 2016-11-11 ENCOUNTER — Encounter: Payer: Self-pay | Admitting: Obstetrics and Gynecology

## 2016-11-11 ENCOUNTER — Encounter
Admission: RE | Admit: 2016-11-11 | Discharge: 2016-11-11 | Disposition: A | Discharge status: Home / Self Care | Payer: BLUE CROSS/BLUE SHIELD | Source: Ambulatory Visit | Attending: Obstetrics and Gynecology | Admitting: Obstetrics and Gynecology

## 2016-11-11 VITALS — BP 124/78 | Ht 63.0 in | Wt 216.0 lb

## 2016-11-11 DIAGNOSIS — N920 Excessive and frequent menstruation with regular cycle: Secondary | ICD-10-CM

## 2016-11-11 DIAGNOSIS — R1031 Right lower quadrant pain: Secondary | ICD-10-CM

## 2016-11-11 DIAGNOSIS — D259 Leiomyoma of uterus, unspecified: Secondary | ICD-10-CM | POA: Diagnosis not present

## 2016-11-11 DIAGNOSIS — Z01818 Encounter for other preprocedural examination: Secondary | ICD-10-CM | POA: Diagnosis present

## 2016-11-11 LAB — CBC
HEMATOCRIT: 37.3 % (ref 35.0–47.0)
Hemoglobin: 12.5 g/dL (ref 12.0–16.0)
MCH: 25.2 pg — ABNORMAL LOW (ref 26.0–34.0)
MCHC: 33.6 g/dL (ref 32.0–36.0)
MCV: 75 fL — AB (ref 80.0–100.0)
Platelets: 354 10*3/uL (ref 150–440)
RBC: 4.98 MIL/uL (ref 3.80–5.20)
RDW: 15.5 % — AB (ref 11.5–14.5)
WBC: 7.3 10*3/uL (ref 3.6–11.0)

## 2016-11-11 LAB — COMPREHENSIVE METABOLIC PANEL
ALBUMIN: 4 g/dL (ref 3.5–5.0)
ALK PHOS: 58 U/L (ref 38–126)
ALT: 11 U/L — ABNORMAL LOW (ref 14–54)
ANION GAP: 8 (ref 5–15)
AST: 13 U/L — AB (ref 15–41)
BILIRUBIN TOTAL: 0.6 mg/dL (ref 0.3–1.2)
BUN: 7 mg/dL (ref 6–20)
CALCIUM: 9.1 mg/dL (ref 8.9–10.3)
CO2: 26 mmol/L (ref 22–32)
Chloride: 106 mmol/L (ref 101–111)
Creatinine, Ser: 0.71 mg/dL (ref 0.44–1.00)
GFR calc Af Amer: >60 mL/min (ref >60)
GFR calc non Af Amer: >60 mL/min (ref >60)
GLUCOSE: 78 mg/dL (ref 65–99)
Potassium: 3.8 mmol/L (ref 3.5–5.1)
Sodium: 140 mmol/L (ref 135–145)
TOTAL PROTEIN: 7.4 g/dL (ref 6.5–8.1)

## 2016-11-11 LAB — TYPE AND SCREEN
ABO/RH(D): O POS
Antibody Screen: NEGATIVE

## 2016-11-11 NOTE — Telephone Encounter (Signed)
FMLA/DISABILITY forms (2) filled out for [employer] and [employer] and given to [place] for processing.

## 2016-11-11 NOTE — Patient Instructions (Signed)
Your procedure is scheduled on: Thursday 11/17/16 Report to [facility]. 2ND FLOOR MEDICAL MALL ENTRANCE. To find out your arrival time please call [phone] between 1PM - 3PM on Wednesday 11/16/16.  Remember: Instructions that are not followed completely may result in serious medical risk, up to and including death, or upon the discretion of your surgeon and anesthesiologist your surgery may need to be rescheduled.    __X__ 1. Do not eat food or drink liquids after midnight. No gum chewing or hard candies.     __X__ 2. No Alcohol for 24 hours before or after surgery.   ____ 3. Bring all medications with you on the day of surgery if instructed.    __X__ 4. Notify your doctor if there is any change in your medical condition     (cold, fever, infections).             __X___5. No smoking within 24 hours of your surgery.     Do not wear jewelry, make-up, hairpins, clips or nail polish.  Do not wear lotions, powders, or perfumes.   Do not shave 48 hours prior to surgery. Men may shave face and neck.  Do not bring valuables to the hospital.    [facility] is not responsible for any belongings or valuables.               Contacts, dentures or bridgework may not be worn into surgery.  Leave your suitcase in the car. After surgery it may be brought to your room.  For patients admitted to the hospital, discharge time is determined by your                treatment team.   Patients discharged the day of surgery will not be allowed to drive home.   Please read over the following fact sheets that you were given:   MRSA Information   ____ Take these medicines the morning of surgery with A SIP OF WATER:    1. NONE  2.   3.   4.  5.  6.  ____ Fleet Enema (as directed)   __X__ Use CHG Soap as directed  ____ Use inhalers on the day of surgery  ____ Stop metformin 2 days prior to surgery    ____ Take 1/2 of usual insulin dose the night before surgery and none on the morning of surgery.    ____ Stop Coumadin/Plavix/aspirin on   __X__ Stop Anti-inflammatories such as Advil, Aleve, Ibuprofen, Motrin, Naproxen, Naprosyn, Goodies,powder, or aspirin products.  OK to take Tylenol.   ____ Stop supplements until after surgery.    ____ Bring C-Pap to the hospital.

## 2016-11-11 NOTE — Progress Notes (Signed)
Preoperative History and Physical  Natasha Wolfe is a 41 y.o. [id number] here for surgical management of abnormal uterine bleeding (menorrhagia).   No significant preoperative concerns.  History of Present Illness: 41 y.o. [name] female with menorrhagia with regular menses.  She has about an 8 year history of heavy periods. Her menses, once per month and last about 7 days. She uses an entire bag of pads per day on the heavy days. She notes the passage of large clots. She associates pain on her right lower quadrant. She has been evaluated for this in the past and 4 years ago she had a normal Pap smear, no STDs, an ultrasound that showed a 2 cm fibroid. At that time she had decided to use an IUD for symptom control.  She has not returned since that time and has used no hormonal medication for control of her symptoms. She had an endometrial biopsy that was essentially normal. Her pap smear was normal. She did have trichomonas on her STD screen. Otherwise she had a normal work up  Proposed surgery: total laparoscopic hysterectomy, bilateral salpingectomy, cystoscopy  Past Medical History:  Diagnosis Date  . Anxiety   . Dysmenorrhea   . Fibroid   . Pelvic pain    Past Surgical History:  Procedure Laterality Date  . HERNIA REPAIR    . TUBAL LIGATION     OB History  Gravida Para Term Preterm AB Living  8 4 3 1 2 4   SAB TAB Ectopic Multiple Live Births  2       4    # Outcome Date GA Lbr Len/2nd Weight Sex Delivery Anes PTL Lv  8 Gravida           7 Gravida           6 SAB           5 Preterm           4 SAB           3 Term      Vag-Spont   LIV  2 Term      Vag-Spont   LIV  1 Term      Vag-Spont   LIV    Patient denies any other pertinent gynecologic issues.   Current Outpatient Prescriptions on File Prior to Visit  Medication Sig Dispense Refill  . metroNIDAZOLE (FLAGYL) 500 MG tablet Take two tablets by mouth twice a day, for one day.  Or you can take all four tablets at once if you  can tolerate it. (Patient not taking: Reported on 11/10/2016) 4 tablet 0   No current facility-administered medications on file prior to visit.    No Known Allergies  Social History:   reports that she has been smoking Cigarettes.  She has been smoking about 0.50 packs per day. She has never used smokeless tobacco. She reports that she does not drink alcohol or use drugs.  Family History  Problem Relation Age of Onset  . Cancer Maternal Grandfather     Review of Systems: Noncontributory  PHYSICAL EXAM: Blood pressure 124/78, height 5\' 3"  (1.6 m), weight 216 lb (98 kg). CONSTITUTIONAL: Well-developed, well-nourished female in no acute distress.  HENT:  Normocephalic, atraumatic, External right and left ear normal. Oropharynx is clear and moist EYES: Conjunctivae and EOM are normal. Pupils are equal, round, and reactive to light. No scleral icterus.  NECK: Normal range of motion, supple, no masses SKIN: Skin is warm and dry. No rash noted.  Not diaphoretic. No erythema. No pallor. [place]: Alert and oriented to person, place, and time. Normal reflexes, muscle tone coordination. No cranial nerve deficit noted. PSYCHIATRIC: Normal mood and affect. Normal behavior. Normal judgment and thought content. CARDIOVASCULAR: Normal heart rate noted, regular rhythm RESPIRATORY: Effort and breath sounds normal, no problems with respiration noted ABDOMEN: Soft, nontender, nondistended. PELVIC: Deferred MUSCULOSKELETAL: Normal range of motion. No edema and no tenderness. 2+ distal pulses.  Assessment: Patient Active Problem List   Diagnosis Date Noted  . Menorrhagia with regular cycle 10/31/2016  . Fibroid uterus 10/31/2016  . Right lower quadrant abdominal pain 10/31/2016    Plan: Patient will undergo surgical management with total laparoscopic hysterectomy, bilateral salpingectomy, cystoscopy.   The risks of surgery were discussed in detail with the patient including but not limited to:  bleeding which may require transfusion or reoperation; infection which may require antibiotics; injury to surrounding organs which may involve bowel, bladder, ureters ; need for additional procedures including laparoscopy or laparotomy; thromboembolic phenomenon, surgical site problems and other postoperative/anesthesia complications. Likelihood of success in alleviating the patient's condition was discussed. Routine postoperative instructions will be reviewed with the patient and her family in detail after surgery.  The patient concurred with the proposed plan, giving informed written consent for the surgery.  Preoperative prophylactic antibiotics and SCDs ordered on call to the OR.    Prentice Docker, MD 11/11/2016 10:20 AM

## 2016-11-17 ENCOUNTER — Encounter
Admission: RE | Disposition: A | Discharge status: Home / Self Care | Payer: Self-pay | Source: Ambulatory Visit | Attending: Obstetrics and Gynecology

## 2016-11-17 ENCOUNTER — Ambulatory Visit
Admission: RE | Admit: 2016-11-17 | Discharge: 2016-11-17 | Disposition: A | Discharge status: Home / Self Care | Payer: BLUE CROSS/BLUE SHIELD | Source: Ambulatory Visit | Attending: Obstetrics and Gynecology | Admitting: Obstetrics and Gynecology

## 2016-11-17 ENCOUNTER — Ambulatory Visit: Payer: BLUE CROSS/BLUE SHIELD | Admitting: Certified Registered"

## 2016-11-17 DIAGNOSIS — D259 Leiomyoma of uterus, unspecified: Secondary | ICD-10-CM | POA: Diagnosis present

## 2016-11-17 DIAGNOSIS — F17210 Nicotine dependence, cigarettes, uncomplicated: Secondary | ICD-10-CM | POA: Diagnosis not present

## 2016-11-17 DIAGNOSIS — N800 Endometriosis of uterus: Secondary | ICD-10-CM | POA: Insufficient documentation

## 2016-11-17 DIAGNOSIS — F419 Anxiety disorder, unspecified: Secondary | ICD-10-CM | POA: Diagnosis not present

## 2016-11-17 DIAGNOSIS — K660 Peritoneal adhesions (postprocedural) (postinfection): Secondary | ICD-10-CM | POA: Diagnosis not present

## 2016-11-17 DIAGNOSIS — N72 Inflammatory disease of cervix uteri: Secondary | ICD-10-CM | POA: Diagnosis not present

## 2016-11-17 DIAGNOSIS — N920 Excessive and frequent menstruation with regular cycle: Secondary | ICD-10-CM | POA: Diagnosis present

## 2016-11-17 DIAGNOSIS — R1031 Right lower quadrant pain: Secondary | ICD-10-CM | POA: Diagnosis present

## 2016-11-17 HISTORY — PX: LAPAROSCOPIC LYSIS OF ADHESIONS: SHX5905

## 2016-11-17 HISTORY — PX: LAPAROSCOPIC HYSTERECTOMY: SHX1926

## 2016-11-17 HISTORY — PX: CYSTOSCOPY: SHX5120

## 2016-11-17 LAB — POCT PREGNANCY, URINE: PREG TEST UR: NEGATIVE

## 2016-11-17 LAB — ABO/RH: ABO/RH(D): O POS

## 2016-11-17 SURGERY — HYSTERECTOMY, TOTAL, LAPAROSCOPIC
Anesthesia: General | Wound class: Clean Contaminated

## 2016-11-17 MED ORDER — OXYCODONE-ACETAMINOPHEN 5-325 MG PO TABS: 2.0000 | ORAL_TABLET | Freq: Four times a day (QID) | ORAL | 0 refills | Status: DC | PRN

## 2016-11-17 MED ORDER — SUGAMMADEX SODIUM 200 MG/2ML IV SOLN
INTRAVENOUS | Status: AC
  Filled 2016-11-17: qty 2

## 2016-11-17 MED ORDER — OXYCODONE HCL 5 MG/5ML PO SOLN: 5.0000 mg | Freq: Once | ORAL | Status: AC | PRN

## 2016-11-17 MED ORDER — ROCURONIUM BROMIDE 50 MG/5ML IV SOLN
INTRAVENOUS | Status: AC
  Filled 2016-11-17: qty 1

## 2016-11-17 MED ORDER — FAMOTIDINE 20 MG PO TABS
20.0000 mg | ORAL_TABLET | Freq: Once | ORAL | Status: AC
  Administered 2016-11-17: 20 mg via ORAL

## 2016-11-17 MED ORDER — BUPIVACAINE HCL 0.5 % IJ SOLN
INTRAMUSCULAR | Status: DC | PRN
  Administered 2016-11-17: 18 mL

## 2016-11-17 MED ORDER — DEXAMETHASONE SODIUM PHOSPHATE 10 MG/ML IJ SOLN
INTRAMUSCULAR | Status: DC | PRN
  Administered 2016-11-17: 4 mg via INTRAVENOUS

## 2016-11-17 MED ORDER — PROPOFOL 10 MG/ML IV BOLUS
INTRAVENOUS | Status: DC | PRN
  Administered 2016-11-17: 40 mg via INTRAVENOUS
  Administered 2016-11-17: 160 mg via INTRAVENOUS

## 2016-11-17 MED ORDER — CEFAZOLIN SODIUM-DEXTROSE 2-4 GM/100ML-% IV SOLN
2.0000 g | INTRAVENOUS | Status: AC
  Administered 2016-11-17: 2 g via INTRAVENOUS

## 2016-11-17 MED ORDER — BUPIVACAINE HCL (PF) 0.5 % IJ SOLN
INTRAMUSCULAR | Status: AC
  Filled 2016-11-17: qty 30

## 2016-11-17 MED ORDER — ACETAMINOPHEN NICU IV SYRINGE 10 MG/ML
INTRAVENOUS | Status: AC
  Filled 2016-11-17: qty 1

## 2016-11-17 MED ORDER — FAMOTIDINE 20 MG PO TABS
ORAL_TABLET | ORAL | Status: AC
  Filled 2016-11-17: qty 1

## 2016-11-17 MED ORDER — FENTANYL CITRATE (PF) 100 MCG/2ML IJ SOLN
INTRAMUSCULAR | Status: AC
  Filled 2016-11-17: qty 2

## 2016-11-17 MED ORDER — CEFAZOLIN SODIUM-DEXTROSE 2-4 GM/100ML-% IV SOLN
INTRAVENOUS | Status: AC
  Filled 2016-11-17: qty 100

## 2016-11-17 MED ORDER — ONDANSETRON HCL 4 MG/2ML IJ SOLN
INTRAMUSCULAR | Status: DC | PRN
  Administered 2016-11-17: 4 mg via INTRAVENOUS

## 2016-11-17 MED ORDER — MIDAZOLAM HCL 2 MG/2ML IJ SOLN
INTRAMUSCULAR | Status: DC | PRN
  Administered 2016-11-17: 2 mg via INTRAVENOUS

## 2016-11-17 MED ORDER — MIDAZOLAM HCL 2 MG/2ML IJ SOLN
INTRAMUSCULAR | Status: AC
  Filled 2016-11-17: qty 2

## 2016-11-17 MED ORDER — ONDANSETRON 8 MG PO TBDP: 8.0000 mg | ORAL_TABLET | Freq: Three times a day (TID) | ORAL | 0 refills | Status: DC | PRN

## 2016-11-17 MED ORDER — LACTATED RINGERS IV SOLN
INTRAVENOUS | Status: DC
  Administered 2016-11-17 (×2): via INTRAVENOUS

## 2016-11-17 MED ORDER — FENTANYL CITRATE (PF) 100 MCG/2ML IJ SOLN
INTRAMUSCULAR | Status: DC | PRN
  Administered 2016-11-17: 150 ug via INTRAVENOUS
  Administered 2016-11-17 (×2): 50 ug via INTRAVENOUS

## 2016-11-17 MED ORDER — SUCCINYLCHOLINE CHLORIDE 20 MG/ML IJ SOLN
INTRAMUSCULAR | Status: DC | PRN
  Administered 2016-11-17: 100 mg via INTRAVENOUS

## 2016-11-17 MED ORDER — ACETAMINOPHEN 10 MG/ML IV SOLN
INTRAVENOUS | Status: DC | PRN
  Administered 2016-11-17: 1000 mg via INTRAVENOUS

## 2016-11-17 MED ORDER — SUCCINYLCHOLINE CHLORIDE 20 MG/ML IJ SOLN
INTRAMUSCULAR | Status: AC
  Filled 2016-11-17: qty 1

## 2016-11-17 MED ORDER — OXYCODONE HCL 5 MG PO TABS
5.0000 mg | ORAL_TABLET | Freq: Once | ORAL | Status: AC | PRN
  Administered 2016-11-17: 5 mg via ORAL

## 2016-11-17 MED ORDER — IBUPROFEN 600 MG PO TABS
600.0000 mg | ORAL_TABLET | Freq: Four times a day (QID) | ORAL | 0 refills | Status: DC
Start: 2016-11-17 — End: 2017-01-05

## 2016-11-17 MED ORDER — FENTANYL CITRATE (PF) 100 MCG/2ML IJ SOLN
25.0000 ug | INTRAMUSCULAR | Status: DC | PRN
  Administered 2016-11-17 (×3): 25 ug via INTRAVENOUS

## 2016-11-17 MED ORDER — LIDOCAINE HCL (PF) 2 % IJ SOLN
INTRAMUSCULAR | Status: AC
  Filled 2016-11-17: qty 2

## 2016-11-17 MED ORDER — PROPOFOL 10 MG/ML IV BOLUS
INTRAVENOUS | Status: AC
  Filled 2016-11-17: qty 20

## 2016-11-17 MED ORDER — PROMETHAZINE HCL 25 MG/ML IJ SOLN: 6.2500 mg | INTRAMUSCULAR | Status: DC | PRN

## 2016-11-17 MED ORDER — ONDANSETRON HCL 4 MG/2ML IJ SOLN
INTRAMUSCULAR | Status: AC
  Filled 2016-11-17: qty 2

## 2016-11-17 MED ORDER — SUGAMMADEX SODIUM 200 MG/2ML IV SOLN
INTRAVENOUS | Status: DC | PRN
  Administered 2016-11-17: 200 mg via INTRAVENOUS

## 2016-11-17 MED ORDER — ROCURONIUM BROMIDE 100 MG/10ML IV SOLN
INTRAVENOUS | Status: DC | PRN
  Administered 2016-11-17: 5 mg via INTRAVENOUS
  Administered 2016-11-17 (×2): 10 mg via INTRAVENOUS
  Administered 2016-11-17: 45 mg via INTRAVENOUS

## 2016-11-17 MED ORDER — MEPERIDINE HCL 50 MG/ML IJ SOLN: 6.2500 mg | INTRAMUSCULAR | Status: DC | PRN

## 2016-11-17 MED ORDER — OXYCODONE HCL 5 MG PO TABS
ORAL_TABLET | ORAL | Status: AC
  Filled 2016-11-17: qty 1

## 2016-11-17 MED ORDER — FENTANYL CITRATE (PF) 250 MCG/5ML IJ SOLN
INTRAMUSCULAR | Status: AC
  Filled 2016-11-17: qty 5

## 2016-11-17 MED ORDER — DEXAMETHASONE SODIUM PHOSPHATE 10 MG/ML IJ SOLN
INTRAMUSCULAR | Status: AC
  Filled 2016-11-17: qty 1

## 2016-11-17 MED ORDER — ALBUTEROL SULFATE HFA 108 (90 BASE) MCG/ACT IN AERS
INHALATION_SPRAY | RESPIRATORY_TRACT | Status: DC | PRN
  Administered 2016-11-17: 6 via RESPIRATORY_TRACT

## 2016-11-17 SURGICAL SUPPLY — 58 items
BAG URO DRAIN 2000ML W/SPOUT (MISCELLANEOUS) ×4 IMPLANT
BLADE SURG SZ11 CARB STEEL (BLADE) ×4 IMPLANT
CATH FOLEY 2WAY  5CC 16FR (CATHETERS) ×2
CATH ROBINSON RED A/P 16FR (CATHETERS) IMPLANT
CATH URTH 16FR FL 2W BLN LF (CATHETERS) ×2 IMPLANT
CHLORAPREP W/TINT 26ML (MISCELLANEOUS) ×4 IMPLANT
DEFOGGER SCOPE WARMER CLEARIFY (MISCELLANEOUS) ×4 IMPLANT
DERMABOND ADVANCED (GAUZE/BANDAGES/DRESSINGS) ×2
DERMABOND ADVANCED .7 DNX12 (GAUZE/BANDAGES/DRESSINGS) ×2 IMPLANT
DEVICE SUTURE ENDOST 10MM (ENDOMECHANICALS) ×4 IMPLANT
DRAPE CAMERA VIDEO/LASER (DRAPES) ×4 IMPLANT
DRAPE LEGGINS SURG 28X43 STRL (DRAPES) ×4 IMPLANT
DRAPE SHEET LG 3/4 BI-LAMINATE (DRAPES) ×4 IMPLANT
DRAPE UNDER BUTTOCK W/FLU (DRAPES) ×4 IMPLANT
GLOVE BIO SURGEON STRL SZ7 (GLOVE) ×28 IMPLANT
GLOVE BIOGEL PI IND STRL 7.5 (GLOVE) ×6 IMPLANT
GLOVE BIOGEL PI INDICATOR 7.5 (GLOVE) ×6
GLOVE INDICATOR 7.5 STRL GRN (GLOVE) ×4 IMPLANT
GOWN STRL REUS W/ TWL LRG LVL3 (GOWN DISPOSABLE) ×6 IMPLANT
GOWN STRL REUS W/ TWL XL LVL3 (GOWN DISPOSABLE) ×2 IMPLANT
GOWN STRL REUS W/TWL LRG LVL3 (GOWN DISPOSABLE) ×6
GOWN STRL REUS W/TWL XL LVL3 (GOWN DISPOSABLE) ×2
GRASPER SUT TROCAR 14GX15 (MISCELLANEOUS) ×4 IMPLANT
IRRIGATION STRYKERFLOW (MISCELLANEOUS) ×2 IMPLANT
IRRIGATOR STRYKERFLOW (MISCELLANEOUS) ×4
IV LACTATED RINGERS 1000ML (IV SOLUTION) ×8 IMPLANT
KIT PINK PAD W/HEAD ARE REST (MISCELLANEOUS) ×4
KIT PINK PAD W/HEAD ARM REST (MISCELLANEOUS) ×2 IMPLANT
KIT RM TURNOVER CYSTO AR (KITS) ×4 IMPLANT
LABEL OR SOLS (LABEL) IMPLANT
LIGASURE BLUNT 5MM 37CM (INSTRUMENTS) ×4 IMPLANT
MANIPULATOR VCARE LG CRV RETR (MISCELLANEOUS) ×4 IMPLANT
MANIPULATOR VCARE SML CRV RETR (MISCELLANEOUS) IMPLANT
MANIPULATOR VCARE STD CRV RETR (MISCELLANEOUS) IMPLANT
NDL SAFETY 22GX1.5 (NEEDLE) ×4 IMPLANT
OCCLUDER COLPOPNEUMO (BALLOONS) ×4 IMPLANT
PACK LAP CHOLECYSTECTOMY (MISCELLANEOUS) ×4 IMPLANT
PAD OB MATERNITY 4.3X12.25 (PERSONAL CARE ITEMS) ×4 IMPLANT
PAD PREP 24X41 OB/GYN DISP (PERSONAL CARE ITEMS) ×4 IMPLANT
SCISSORS METZENBAUM CVD 33 (INSTRUMENTS) ×4 IMPLANT
SET CYSTO W/LG BORE CLAMP LF (SET/KITS/TRAYS/PACK) ×4 IMPLANT
SLEEVE ENDOPATH XCEL 5M (ENDOMECHANICALS) ×8 IMPLANT
SOL PREP PVP 2OZ (MISCELLANEOUS) ×4
SOLUTION ELECTROLUBE (MISCELLANEOUS) ×4 IMPLANT
SOLUTION PREP PVP 2OZ (MISCELLANEOUS) ×2 IMPLANT
SPONGE XRAY 4X4 16PLY STRL (MISCELLANEOUS) IMPLANT
SURGILUBE 2OZ TUBE FLIPTOP (MISCELLANEOUS) ×4 IMPLANT
SUT ENDO VLOC 180-0-8IN (SUTURE) ×4 IMPLANT
SUT MNCRL 4-0 (SUTURE) ×4
SUT MNCRL 4-0 27XMFL (SUTURE) ×4
SUT VIC AB 0 CT1 36 (SUTURE) ×4 IMPLANT
SUTURE MNCRL 4-0 27XMF (SUTURE) ×4 IMPLANT
SYR 50ML LL SCALE MARK (SYRINGE) IMPLANT
SYRINGE 10CC LL (SYRINGE) ×4 IMPLANT
TROCAR ENDO BLADELESS 11MM (ENDOMECHANICALS) ×4 IMPLANT
TROCAR XCEL NON-BLD 5MMX100MML (ENDOMECHANICALS) ×4 IMPLANT
TUBING INSUF HEATED (TUBING) ×4 IMPLANT
WATER STERILE IRR 3000ML UROMA (IV SOLUTION) ×4 IMPLANT

## 2016-11-17 NOTE — Op Note (Signed)
Operative Note    Pre-Op Diagnosis:  1) menorrhagia with regular cycle 2) Fibroid uterus  Post-Op Diagnosis:  1) menorrhagia with regular cycle 2) Fibroid uterus  Procedures:  1. Total Laparoscopic Hysterectomy 2. Bilateral Salpingectomy 3. Cystoscopy 4. Lysis of adhesions  Primary Surgeon: Prentice Docker, MD   Assistant Surgeon: Malachy Mood, MD  EBL: 150 mL   IVF: 600 mL   Urine output: 150 mL  Specimens: cervix, uterus, bilateral fallopian tubes  Drains: none  Complications: None   Disposition: PACU   Condition: Stable   Findings:  1) adhesion of omentum to anterior abdominal wall at umbilicus 2) Right antero-lateral fibroid (~4cm) 3) normal appearing fallopian tubes (status post tubal ligation) 4) normal appearing bilateral ovaries 5) cystoscopy showing no bladder defects and efflux of urine from bilateral ureteral orifices  Procedure Summary:  The patient was taken to the operating room where general anesthesia was administered and found to be adequate. She was placed in the dorsal supine lithotomy position in [place] stirrups and prepped and draped in usual sterile fashion. After a timeout was called, an indwelling catheter was placed in her bladder. A sterile speculum was placed in the vagina and a single-tooth tenaculum was used to grasp the anterior lip of the cervix. A V-Care uterine manipulator was affixed to the uterus in accordance to the manufacturers recommendations. The speculum and tenaculum was removed from the vagina.  Attention was turned to the abdomen where, after injection of local anesthetic, a 5 mm incision was made with the scalpel at Palmer's point. Entry into the abdomen was obtained via Optiview trocar technique (a blunt entry technique with camera visualization through the obturator upon entry). Verification of entry into the abdomen was obtained using opening pressures. The abdomen was insufflated with CO2. The camera was introduced  through the trocar with verification of atraumatic entry. A left lower quadrant 5 mm port was created via direct intra-abdominal camera visualization without difficulty. An 11 mm right lower quadrant port was placed in a similar fashion without difficulty.  The adhesions of the omentum were taken down at the abdominal wall using the LigaSure device.  The tissue was verified to be free of bowel prior to resection.  A 5 mm infra-umbilical port was created about 2 finger-breadths inferior to the level of the umbilicus, without difficulty.  Of note, no mesh was noted. However, the surgery was remote.   After inspection of the abdomen and pelvis with the above-noted findings, the bilateral ureters were identified and found to be well away from the operative area of interest. The left fallopian tube was grasped at the fimbriated end and was transected using the LigaSure along the mesosalpinx in a lateral to medial fashion. The LigaSure then was used to transect the left round ligament and the utero-ovarian ligament was transected. Tissue was divided along the left broad ligament to the level of the anterior cervical os. The lower uterine segment was identified. Bladder tissue was dissected off the lower uterine segment and cervix without difficulty. The left uterine artery was skeletonized and identified and after ligation was transected with the LigaSure device. The same procedure was carried out on the right side. The colpotomy was performed using monopolar electrocautery in a circumferential fashion following the KOH ring.  The vaginal occluder balloon was insufflated prior to colpotomy to prevent loss of pneumoperitoneum. The uterus and fallopian tubes and cervix were removed through the vagina en bloc.  Closure of the vaginal cuff was undertaken using the V-lock  stitch in a running fashion. A gloved hand was placed in the vagina to assess adequate closure of the vaginal cuff. This was found to be satisfactory.  All vascular pedicles were inspected and found to be hemostatic. Copious irrigation was undertaken and hemostasis was again verified.   Cystoscopy was undertaken at this point. The Foley catheter was removed and the 70 cystoscope was gently introduced through the urethra. The bladder survey was undertaken with efflux of urine from bilateral orifices noted. There were no defects noted in the bladder wall. The cystoscope was removed and the Foley catheter was replaced.  The right lower quadrant trocar was removed and the fascia was reapproximated using #0 Vicryl with a single stitch. The abdomen was then desufflated of CO2 after removal of all instruments. Five deep breaths were given by anesthesia to the patient to help with removal of CO2 from the abdomen. The right lower quadrant skin incision was closed using 4-0 Vicryl in a subcuticular fashion. The remaining skin incisions were closed at the subcutaneous level using 4-0 Monocryl to reduce tension on the skin closure.  The skin was closed at all port sites using surgical skin glue. The catheter was then removed from the bladder. The vagina was inspected and found to be free of instrumentation and sponges.  The patient tolerated the procedure well.  Sponge, lap, needle, and instrument counts were correct x 2.  VTE prophylaxis: SCDs. Antibiotic prophylaxis: Ancef 2 grams IV within 30 minutes of skin incision. She was awakened in the operating room and was taken to the PACU in stable condition.   Prentice Docker, MD 11/17/2016 12:27 PM

## 2016-11-17 NOTE — Anesthesia Preprocedure Evaluation (Signed)
Anesthesia Evaluation  Patient identified by MRN, date of birth, ID band Patient awake    Reviewed: Allergy & Precautions, NPO status , Patient's Chart, lab work & pertinent test results  History of Anesthesia Complications Negative for: history of anesthetic complications  Airway Mallampati: II  TM Distance: >3 FB Neck ROM: Full    Dental no notable dental hx.    Pulmonary neg pulmonary ROS, neg sleep apnea, neg COPD, Current Smoker,    breath sounds clear to auscultation- rhonchi (-) wheezing      Cardiovascular Exercise Tolerance: Good (-) hypertension(-) CAD, (-) Past MI and (-) Cardiac Stents  Rhythm:Regular Rate:Normal - Systolic murmurs and - Diastolic murmurs    Neuro/Psych Anxiety negative neurological ROS     GI/Hepatic negative GI ROS, Neg liver ROS,   Endo/Other  negative endocrine ROSneg diabetes  Renal/GU negative Renal ROS     Musculoskeletal negative musculoskeletal ROS (+)   Abdominal (+) + obese,   Peds  Hematology negative hematology ROS (+)   Anesthesia Other Findings Past Medical History: No date: Anxiety No date: Dysmenorrhea No date: Fibroid No date: Pelvic pain   Reproductive/Obstetrics                             Anesthesia Physical Anesthesia Plan  ASA: II  Anesthesia Plan: General   Post-op Pain Management:    Induction: Intravenous  PONV Risk Score and Plan: 1 and Ondansetron and Dexamethasone  Airway Management Planned: Oral ETT  Additional Equipment:   Intra-op Plan:   Post-operative Plan: Extubation in OR  Informed Consent: I have reviewed the patients History and Physical, chart, labs and discussed the procedure including the risks, benefits and alternatives for the proposed anesthesia with the patient or authorized representative who has indicated his/her understanding and acceptance.   Dental advisory given  Plan Discussed with: CRNA  and Anesthesiologist  Anesthesia Plan Comments:         Anesthesia Quick Evaluation

## 2016-11-17 NOTE — Transfer of Care (Signed)
Immediate Anesthesia Transfer of Care Note  Patient: Natasha Wolfe  Procedure(s) Performed: Procedure(s): HYSTERECTOMY TOTAL LAPAROSCOPIC BILATERAL SALPINGECTOMY (N/A) CYSTOSCOPY (N/A) LAPAROSCOPIC LYSIS OF ADHESIONS  Patient Location: PACU  Anesthesia Type:General  Level of Consciousness: sedated and responds to stimulation  Airway & Oxygen Therapy: Patient Spontanous Breathing and Patient connected to face mask oxygen  Post-op Assessment: Report given to RN and Post -op Vital signs reviewed and stable  Post vital signs: Reviewed and stable  Last Vitals:  Vitals:   11/17/16 0801 11/17/16 1239  BP: 126/79 120/68  Pulse: (!) 107 (!) 120  Resp:  (!) 25  Temp:    SpO2: 100% 99%    Last Pain:  Vitals:   11/17/16 0757  TempSrc: Tympanic         Complications: No apparent anesthesia complications

## 2016-11-17 NOTE — Discharge Instructions (Signed)
AMBULATORY SURGERY  °DISCHARGE INSTRUCTIONS ° ° °1) The drugs that you were given will stay in your system until tomorrow so for the next 24 hours you should not: ° °A) Drive an automobile °B) Make any legal decisions °C) Drink any alcoholic beverage ° ° °2) You may resume regular meals tomorrow.  Today it is better to start with liquids and gradually work up to solid foods. ° °You may eat anything you prefer, but it is better to start with liquids, then soup and crackers, and gradually work up to solid foods. ° ° °3) Please notify your doctor immediately if you have any unusual bleeding, trouble breathing, redness and pain at the surgery site, drainage, fever, or pain not relieved by medication. ° ° ° °4) Additional Instructions: ° ° ° ° ° ° ° °Please contact your physician with any problems or Same Day Surgery at [phone], Monday through Friday 6 am to 4 pm, or [place] at [address] [facility] number at [phone]. °

## 2016-11-17 NOTE — Anesthesia Post-op Follow-up Note (Signed)
Anesthesia QCDR form completed.        

## 2016-11-17 NOTE — Anesthesia Postprocedure Evaluation (Signed)
Anesthesia Post Note  Patient: Shilah Hefel  Procedure(s) Performed: Procedure(s) (LRB): HYSTERECTOMY TOTAL LAPAROSCOPIC BILATERAL SALPINGECTOMY (N/A) CYSTOSCOPY (N/A) LAPAROSCOPIC LYSIS OF ADHESIONS  Patient location during evaluation: PACU Anesthesia Type: General Level of consciousness: awake and alert and oriented Pain management: pain level controlled Vital Signs Assessment: post-procedure vital signs reviewed and stable Respiratory status: spontaneous breathing, nonlabored ventilation and respiratory function stable Cardiovascular status: blood pressure returned to baseline and stable Postop Assessment: no signs of nausea or vomiting Anesthetic complications: no     Last Vitals:  Vitals:   11/17/16 1343 11/17/16 1404  BP: 118/73   Pulse: 98 90  Resp: 19 18  Temp: 36.8 C   SpO2: 99% 99%    Last Pain:  Vitals:   11/17/16 1403  TempSrc:   PainSc: 4                  Shahara Hartsfield

## 2016-11-17 NOTE — Anesthesia Procedure Notes (Signed)
Procedure Name: Intubation Performed by: Lance Muss Pre-anesthesia Checklist: Patient identified, Patient being monitored, Timeout performed, Emergency Drugs available and Suction available Patient Re-evaluated:Patient Re-evaluated prior to induction Oxygen Delivery Method: Circle system utilized Preoxygenation: Pre-oxygenation with 100% oxygen Induction Type: IV induction Ventilation: Mask ventilation without difficulty, Oral airway inserted - appropriate to patient size and Two handed mask ventilation required Laryngoscope Size: 3 and Mac Grade View: Grade III Tube type: Oral Tube size: 7.0 mm Number of attempts: 1 Airway Equipment and Method: Stylet Placement Confirmation: ETT inserted through vocal cords under direct vision,  positive ETCO2 and breath sounds checked- equal and bilateral Secured at: 21 cm Tube secured with: Tape Dental Injury: Teeth and Oropharynx as per pre-operative assessment  Difficulty Due To: Difficult Airway- due to anterior larynx Future Recommendations: Recommend- induction with short-acting agent, and alternative techniques readily available Comments: First DL with MAC 3 blade, grade 3 view, anterior airway. Second attempt with Mcgraph 3 with grade 1 view, +BBS, +ETCO2. Patient tolerated well. Recommend using videolaryngoscopy first.

## 2016-11-17 NOTE — Interval H&P Note (Signed)
History and Physical Interval Note:  11/17/2016 8:55 AM  Natasha Wolfe  has presented today for surgery, with the diagnosis of menorrhagia with regular cycle,uterine fibroid,pelvic pain  The various methods of treatment have been discussed with the patient and family. After consideration of risks, benefits and other options for treatment, the patient has consented to  Procedure(s): HYSTERECTOMY TOTAL LAPAROSCOPIC BILATERAL SALPINGECTOMY (N/A) CYSTOSCOPY (N/A) as a surgical intervention .  The patient's history has been reviewed, patient examined, no change in status, stable for surgery.  I have reviewed the patient's chart and labs.  Questions were answered to the patient's satisfaction.  The consent has been reviewed and she agrees to proceed understanding the risks and benefits of the procedure.   Prentice Docker, MD 11/17/2016 8:55 AM

## 2016-11-17 NOTE — H&P (View-Only) (Signed)
Preoperative History and Physical  Natasha Wolfe is a 41 y.o. [id number] here for surgical management of abnormal uterine bleeding (menorrhagia).   No significant preoperative concerns.  History of Present Illness: 41 y.o. [name] female with menorrhagia with regular menses.  She has about an 8 year history of heavy periods. Her menses, once per month and last about 7 days. She uses an entire bag of pads per day on the heavy days. She notes the passage of large clots. She associates pain on her right lower quadrant. She has been evaluated for this in the past and 4 years ago she had a normal Pap smear, no STDs, an ultrasound that showed a 2 cm fibroid. At that time she had decided to use an IUD for symptom control.  She has not returned since that time and has used no hormonal medication for control of her symptoms. She had an endometrial biopsy that was essentially normal. Her pap smear was normal. She did have trichomonas on her STD screen. Otherwise she had a normal work up  Proposed surgery: total laparoscopic hysterectomy, bilateral salpingectomy, cystoscopy  Past Medical History:  Diagnosis Date  . Anxiety   . Dysmenorrhea   . Fibroid   . Pelvic pain    Past Surgical History:  Procedure Laterality Date  . HERNIA REPAIR    . TUBAL LIGATION     OB History  Gravida Para Term Preterm AB Living  8 4 3 1 2 4   SAB TAB Ectopic Multiple Live Births  2       4    # Outcome Date GA Lbr Len/2nd Weight Sex Delivery Anes PTL Lv  8 Gravida           7 Gravida           6 SAB           5 Preterm           4 SAB           3 Term      Vag-Spont   LIV  2 Term      Vag-Spont   LIV  1 Term      Vag-Spont   LIV    Patient denies any other pertinent gynecologic issues.   Current Outpatient Prescriptions on File Prior to Visit  Medication Sig Dispense Refill  . metroNIDAZOLE (FLAGYL) 500 MG tablet Take two tablets by mouth twice a day, for one day.  Or you can take all four tablets at once if you  can tolerate it. (Patient not taking: Reported on 11/10/2016) 4 tablet 0   No current facility-administered medications on file prior to visit.    No Known Allergies  Social History:   reports that she has been smoking Cigarettes.  She has been smoking about 0.50 packs per day. She has never used smokeless tobacco. She reports that she does not drink alcohol or use drugs.  Family History  Problem Relation Age of Onset  . Cancer Maternal Grandfather     Review of Systems: Noncontributory  PHYSICAL EXAM: Blood pressure 124/78, height 5\' 3"  (1.6 m), weight 216 lb (98 kg). CONSTITUTIONAL: Well-developed, well-nourished female in no acute distress.  HENT:  Normocephalic, atraumatic, External right and left ear normal. Oropharynx is clear and moist EYES: Conjunctivae and EOM are normal. Pupils are equal, round, and reactive to light. No scleral icterus.  NECK: Normal range of motion, supple, no masses SKIN: Skin is warm and dry. No rash noted.  Not diaphoretic. No erythema. No pallor. [place]: Alert and oriented to person, place, and time. Normal reflexes, muscle tone coordination. No cranial nerve deficit noted. PSYCHIATRIC: Normal mood and affect. Normal behavior. Normal judgment and thought content. CARDIOVASCULAR: Normal heart rate noted, regular rhythm RESPIRATORY: Effort and breath sounds normal, no problems with respiration noted ABDOMEN: Soft, nontender, nondistended. PELVIC: Deferred MUSCULOSKELETAL: Normal range of motion. No edema and no tenderness. 2+ distal pulses.  Assessment: Patient Active Problem List   Diagnosis Date Noted  . Menorrhagia with regular cycle 10/31/2016  . Fibroid uterus 10/31/2016  . Right lower quadrant abdominal pain 10/31/2016    Plan: Patient will undergo surgical management with total laparoscopic hysterectomy, bilateral salpingectomy, cystoscopy.   The risks of surgery were discussed in detail with the patient including but not limited to:  bleeding which may require transfusion or reoperation; infection which may require antibiotics; injury to surrounding organs which may involve bowel, bladder, ureters ; need for additional procedures including laparoscopy or laparotomy; thromboembolic phenomenon, surgical site problems and other postoperative/anesthesia complications. Likelihood of success in alleviating the patient's condition was discussed. Routine postoperative instructions will be reviewed with the patient and her family in detail after surgery.  The patient concurred with the proposed plan, giving informed written consent for the surgery.  Preoperative prophylactic antibiotics and SCDs ordered on call to the OR.    Prentice Docker, MD 11/11/2016 10:20 AM

## 2016-11-18 ENCOUNTER — Telehealth: Payer: Self-pay

## 2016-11-18 LAB — SURGICAL PATHOLOGY

## 2016-11-18 NOTE — Telephone Encounter (Signed)
Called pt to check on her after hysterectomy 11/17/2016. Pt states she has been feeling well. No abnormal pain, no nausea, has eaten soup/crackers today. About to take stool softener. Incision doing well, no fever/chills. Pt PO scheduled 8/17. Pt aware to call if she needs anything before her appt.

## 2016-11-21 ENCOUNTER — Telehealth: Payer: Self-pay

## 2016-11-21 NOTE — Telephone Encounter (Signed)
FMLA/DISABILITY additional information for IT[employer] filled out and given to [place] for processing.

## 2016-11-25 ENCOUNTER — Encounter: Payer: Self-pay | Admitting: Obstetrics and Gynecology

## 2016-11-25 ENCOUNTER — Ambulatory Visit (INDEPENDENT_AMBULATORY_CARE_PROVIDER_SITE_OTHER): Payer: BLUE CROSS/BLUE SHIELD | Admitting: Obstetrics and Gynecology

## 2016-11-25 VITALS — BP 126/78 | Wt 214.0 lb

## 2016-11-25 DIAGNOSIS — Z09 Encounter for follow-up examination after completed treatment for conditions other than malignant neoplasm: Secondary | ICD-10-CM

## 2016-11-25 NOTE — Progress Notes (Signed)
   Postoperative Follow-up Patient presents post op from TLH/BS 8days ago for abnormal uterine bleeding.  Subjective: Patient reports marked improvement in her preop symptoms. Eating a regular diet without difficulty. The patient is not having any pain.  Activity: normal activities of daily living.  Objective: Vitals:   11/25/16 1019  BP: 126/78   Vital Signs: BP 126/78   Wt 214 lb (97.1 kg)   LMP 10/28/2016 (Within Days)   BMI 37.91 kg/m  Constitutional: Well nourished, well developed female in no acute distress.  HEENT: normal Skin: Warm and dry.  Extremity: no edeam  Abdomen: Soft, non-tender, normal bowel sounds; no bruits, organomegaly or masses. clean, dry, intact and no erythema, induration, warmth, and tenderness   Assessment: 41 y.o. s/p TLH/BS progressing well  Plan: Patient has done well after surgery with no apparent complications.  I have discussed the post-operative course to date, and the expected progress moving forward.  The patient understands what complications to be concerned about.  I will see the patient in routine follow up, or sooner if needed.    Activity plan: increase activity slowly  Wound care instructions given  Prentice Docker, MD 11/25/2016, 10:33 AM

## 2016-12-27 ENCOUNTER — Ambulatory Visit: Payer: BLUE CROSS/BLUE SHIELD | Admitting: Obstetrics and Gynecology

## 2017-01-05 ENCOUNTER — Encounter: Payer: Self-pay | Admitting: Obstetrics and Gynecology

## 2017-01-05 ENCOUNTER — Ambulatory Visit (INDEPENDENT_AMBULATORY_CARE_PROVIDER_SITE_OTHER): Payer: BLUE CROSS/BLUE SHIELD | Admitting: Obstetrics and Gynecology

## 2017-01-05 VITALS — BP 124/74 | Ht 63.0 in | Wt 220.0 lb

## 2017-01-05 DIAGNOSIS — D259 Leiomyoma of uterus, unspecified: Secondary | ICD-10-CM

## 2017-01-05 DIAGNOSIS — N920 Excessive and frequent menstruation with regular cycle: Secondary | ICD-10-CM

## 2017-01-05 DIAGNOSIS — Z90710 Acquired absence of both cervix and uterus: Secondary | ICD-10-CM
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
## 2017-02-17 ENCOUNTER — Other Ambulatory Visit: Payer: Self-pay

## 2017-02-17 ENCOUNTER — Emergency Department
Admission: EM | Admit: 2017-02-17 | Discharge: 2017-02-17 | Disposition: A | Discharge status: Home / Self Care | Payer: BLUE CROSS/BLUE SHIELD | Attending: Student in an Organized Health Care Education/Training Program | Admitting: Student in an Organized Health Care Education/Training Program

## 2017-02-17 ENCOUNTER — Encounter: Payer: Self-pay | Admitting: Emergency Medicine

## 2017-02-17 ENCOUNTER — Emergency Department: Payer: BLUE CROSS/BLUE SHIELD

## 2017-02-17 DIAGNOSIS — Y929 Unspecified place or not applicable: Secondary | ICD-10-CM | POA: Diagnosis not present

## 2017-02-17 DIAGNOSIS — Y999 Unspecified external cause status: Secondary | ICD-10-CM | POA: Insufficient documentation

## 2017-02-17 DIAGNOSIS — Y93K1 Activity, walking an animal: Secondary | ICD-10-CM | POA: Insufficient documentation

## 2017-02-17 DIAGNOSIS — F17210 Nicotine dependence, cigarettes, uncomplicated: Secondary | ICD-10-CM | POA: Diagnosis not present

## 2017-02-17 DIAGNOSIS — S93402A Sprain of unspecified ligament of left ankle, initial encounter: Secondary | ICD-10-CM | POA: Diagnosis not present

## 2017-02-17 DIAGNOSIS — W010XXA Fall on same level from slipping, tripping and stumbling without subsequent striking against object, initial encounter: Secondary | ICD-10-CM | POA: Diagnosis not present

## 2017-02-17 DIAGNOSIS — S99912A Unspecified injury of left ankle, initial encounter: Secondary | ICD-10-CM | POA: Diagnosis present

## 2017-02-17 MED ORDER — IBUPROFEN 600 MG PO TABS: 600.0000 mg | ORAL_TABLET | Freq: Four times a day (QID) | ORAL | 0 refills | Status: DC | PRN

## 2017-02-17 MED ORDER — IBUPROFEN 800 MG PO TABS
800.0000 mg | ORAL_TABLET | Freq: Once | ORAL | Status: AC
  Administered 2017-02-17: 800 mg via ORAL
  Filled 2017-02-17: qty 1

## 2017-02-17 NOTE — ED Triage Notes (Signed)
Pt arrives via [facility]. Pt states that she was taking her dog out this evening when her dog pulled her down. Pt denies head injury but does have noticeable swelling to left ankle. Pt is in NAD at this time.

## 2017-02-17 NOTE — ED Provider Notes (Signed)
[facility] Emergency Department Provider Note  ____________________________________________  Time seen: Approximately 10:02 PM  I have reviewed the triage vital signs and the nursing notes.   HISTORY  Chief Complaint Ankle Pain    HPI Natasha Wolfe is a 41 y.o. female that presents to emergency department for evaluation of left ankle pain after falling today.  Patient was out walking her dog this evening when her dog pulled her to the ground.  It is painful to move her ankle in all directions.  She has not been able to walk since fall.  No additional injuries.  She did not hit her head or lose consciousness.  No shortness breath, chest pain, nausea, vomiting, abdominal pain, numbness, tingling.  Past Medical History:  Diagnosis Date  . Anxiety   . Dysmenorrhea   . Fibroid   . Pelvic pain     Patient Active Problem List   Diagnosis Date Noted  . Status post laparoscopic hysterectomy 01/05/2017  . Menorrhagia with regular cycle 10/31/2016  . Fibroid uterus 10/31/2016  . Right lower quadrant abdominal pain 10/31/2016    Past Surgical History:  Procedure Laterality Date  . HERNIA REPAIR    . TUBAL LIGATION      Prior to Admission medications   Medication Sig Start Date End Date Taking? Authorizing Provider  ibuprofen (ADVIL,MOTRIN) 600 MG tablet Take 1 tablet (600 mg total) every 6 (six) hours as needed by mouth. 02/17/17   Laban Emperor, PA-C    Allergies Patient has no known allergies.  Family History  Problem Relation Age of Onset  . Cancer Maternal Grandfather     Social History Social History   Tobacco Use  . Smoking status: Current Every Day Smoker    Packs/day: 0.50    Types: Cigarettes  . Smokeless tobacco: Never Used  Substance Use Topics  . Alcohol use: No  . Drug use: No     Review of Systems  Cardiovascular: No chest pain. Respiratory:  No SOB. Gastrointestinal: No abdominal pain.  No nausea, no vomiting.   Musculoskeletal: Positive for ankle pain. Skin: Negative for rash, abrasions, lacerations, ecchymosis. Neurological: Negative for headaches, numbness or tingling   ____________________________________________   PHYSICAL EXAM:  VITAL SIGNS: ED Triage Vitals  Enc Vitals Group     BP 02/17/17 2109 133/84     Pulse Rate 02/17/17 2109 97     Resp 02/17/17 2109 18     Temp 02/17/17 2109 98.4 F (36.9 C)     Temp Source 02/17/17 2109 Oral     SpO2 02/17/17 2109 98 %     Weight 02/17/17 2109 217 lb (98.4 kg)     Height 02/17/17 2109 5\' 4"  (1.626 m)     Head Circumference --      Peak Flow --      Pain Score 02/17/17 2108 10     Pain Loc --      Pain Edu? --      Excl. in [place]? --      Constitutional: Alert and oriented. Well appearing and in no acute distress. Eyes: Conjunctivae are normal. PERRL. EOMI. Head: Atraumatic. ENT:      Ears:      Nose: No congestion/rhinnorhea.      Mouth/Throat: Mucous membranes are moist.  Neck: No stridor.   Cardiovascular: Normal rate, regular rhythm.  Good peripheral circulation.  Symmetric dorsalis pedis pulses bilaterally. Respiratory: Normal respiratory effort without tachypnea or retractions. Lungs CTAB. Good air entry to the  bases with no decreased or absent breath sounds.  Mild swelling and tenderness to palpation over lateral malleolus.  No bruising. Musculoskeletal: Full range of motion to all extremities. No gross deformities appreciated.  Neurologic:  Normal speech and language. No gross focal neurologic deficits are appreciated.  Skin:  Skin is warm, dry and intact. No rash noted.   ____________________________________________   LABS (all labs ordered are listed, but only abnormal results are displayed)  Labs Reviewed - No data to display ____________________________________________  EKG   ____________________________________________  RADIOLOGY Robinette Haines, personally viewed and evaluated these images (plain  radiographs) as part of my medical decision making, as well as reviewing the written report by the radiologist.  Dg Ankle Complete Left  Result Date: 02/17/2017 CLINICAL DATA:  Pt arrives via [facility]. Pt states that she was taking her dog out this evening when her dog pulled her down EXAM: LEFT ANKLE COMPLETE - 3+ VIEW COMPARISON:  None. FINDINGS: Ankle mortise intact. The talar dome is normal. No malleolar fracture. The calcaneus is normal. IMPRESSION: No fracture or dislocation. Electronically Signed   By: Suzy Bouchard M.D.   On: 02/17/2017 21:52    ____________________________________________    PROCEDURES  Procedure(s) performed:    Procedures    Medications  ibuprofen (ADVIL,MOTRIN) tablet 800 mg (800 mg Oral Given 02/17/17 2227)     ____________________________________________   INITIAL IMPRESSION / ASSESSMENT AND PLAN / ED COURSE  Pertinent labs & imaging results that were available during my care of the patient were reviewed by me and considered in my medical decision making (see chart for details).  Review of the [place] CSRS was performed in accordance of the [place] prior to dispensing any controlled drugs.     Patient's diagnosis is consistent with ankle sprain.  Vital signs and exam are reassuring.  X-ray negative for acute bony abnormality.  Ankle was ace wrapped and crutches were given.  Education was provided.  Patient will be discharged home with prescriptions for ibuprofen. Patient is to follow up with PCP as directed. Patient is given ED precautions to return to the ED for any worsening or new symptoms.     ____________________________________________  FINAL CLINICAL IMPRESSION(S) / ED DIAGNOSES  Final diagnoses:  Sprain of left ankle, unspecified ligament, initial encounter      NEW MEDICATIONS STARTED DURING THIS VISIT:  This SmartLink is deprecated. Use AVSMEDLIST instead to display the medication list for a patient.      This chart was  dictated using voice recognition software/Dragon. Despite best efforts to proofread, errors can occur which can change the meaning. Any change was purely unintentional.    Laban Emperor, PA-C 02/18/17 0005    Eula Listen, MD 02/20/17 2150

## 2017-02-17 NOTE — ED Notes (Signed)
Patient coming EMS today after fall. Patient c/o left ankle pain/swelling. Patient has 22 gauge IV right forearm inserted by EMS. Patient given 50 mcg fentanyl by EMS. Patient HR 110 bpm.

## 2017-02-17 NOTE — ED Notes (Signed)
Warm blanket provided, ice pack to left ankle provided.

## 2017-07-18 ENCOUNTER — Other Ambulatory Visit: Payer: Self-pay | Admitting: Physician Assistant

## 2017-07-18 DIAGNOSIS — Z1231 Encounter for screening mammogram for malignant neoplasm of breast: Secondary | ICD-10-CM

## 2017-07-24 ENCOUNTER — Ambulatory Visit
Admission: RE | Admit: 2017-07-24 | Discharge: 2017-07-24 | Disposition: A | Discharge status: Home / Self Care | Payer: BLUE CROSS/BLUE SHIELD | Source: Ambulatory Visit | Attending: Physician Assistant | Admitting: Physician Assistant

## 2017-07-24 DIAGNOSIS — Z1231 Encounter for screening mammogram for malignant neoplasm of breast: Secondary | ICD-10-CM | POA: Insufficient documentation

## 2017-08-05 ENCOUNTER — Emergency Department
Admission: EM | Admit: 2017-08-05 | Discharge: 2017-08-05 | Disposition: A | Discharge status: Home / Self Care | Payer: BLUE CROSS/BLUE SHIELD | Attending: Emergency Medicine | Admitting: Emergency Medicine

## 2017-08-05 ENCOUNTER — Other Ambulatory Visit: Payer: Self-pay

## 2017-08-05 ENCOUNTER — Emergency Department: Payer: BLUE CROSS/BLUE SHIELD

## 2017-08-05 ENCOUNTER — Encounter: Payer: Self-pay | Admitting: Emergency Medicine

## 2017-08-05 DIAGNOSIS — Y929 Unspecified place or not applicable: Secondary | ICD-10-CM | POA: Diagnosis not present

## 2017-08-05 DIAGNOSIS — Y998 Other external cause status: Secondary | ICD-10-CM | POA: Diagnosis not present

## 2017-08-05 DIAGNOSIS — Y939 Activity, unspecified: Secondary | ICD-10-CM | POA: Diagnosis not present

## 2017-08-05 DIAGNOSIS — F17210 Nicotine dependence, cigarettes, uncomplicated: Secondary | ICD-10-CM | POA: Insufficient documentation

## 2017-08-05 DIAGNOSIS — S0033XA Contusion of nose, initial encounter: Secondary | ICD-10-CM

## 2017-08-05 DIAGNOSIS — S0992XA Unspecified injury of nose, initial encounter: Secondary | ICD-10-CM | POA: Diagnosis present

## 2017-08-05 MED ORDER — IBUPROFEN 800 MG PO TABS: 800.0000 mg | ORAL_TABLET | Freq: Three times a day (TID) | ORAL | 0 refills | Status: DC | PRN

## 2017-08-05 MED ORDER — TRAMADOL HCL 50 MG PO TABS: 50.0000 mg | ORAL_TABLET | Freq: Two times a day (BID) | ORAL | 0 refills | Status: DC | PRN

## 2017-08-05 MED ORDER — NAPROXEN 500 MG PO TABS
500.0000 mg | ORAL_TABLET | Freq: Once | ORAL | Status: AC
  Administered 2017-08-05: 500 mg via ORAL
  Filled 2017-08-05: qty 1
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
## 2017-10-19 ENCOUNTER — Emergency Department
Admission: EM | Admit: 2017-10-19 | Discharge: 2017-10-19 | Disposition: A | Discharge status: Home / Self Care | Payer: BLUE CROSS/BLUE SHIELD | Attending: Emergency Medicine | Admitting: Emergency Medicine

## 2017-10-19 ENCOUNTER — Encounter: Payer: Self-pay | Admitting: Emergency Medicine

## 2017-10-19 ENCOUNTER — Other Ambulatory Visit: Payer: Self-pay

## 2017-10-19 ENCOUNTER — Emergency Department: Payer: BLUE CROSS/BLUE SHIELD

## 2017-10-19 DIAGNOSIS — F17210 Nicotine dependence, cigarettes, uncomplicated: Secondary | ICD-10-CM | POA: Diagnosis not present

## 2017-10-19 DIAGNOSIS — J209 Acute bronchitis, unspecified: Secondary | ICD-10-CM | POA: Diagnosis not present

## 2017-10-19 DIAGNOSIS — R05 Cough: Secondary | ICD-10-CM | POA: Diagnosis present

## 2017-10-19 MED ORDER — BENZONATATE 100 MG PO CAPS
100.0000 mg | ORAL_CAPSULE | Freq: Once | ORAL | Status: AC
  Administered 2017-10-19: 100 mg via ORAL
  Filled 2017-10-19: qty 1

## 2017-10-19 MED ORDER — AZITHROMYCIN 500 MG PO TABS
500.0000 mg | ORAL_TABLET | Freq: Once | ORAL | Status: AC
  Administered 2017-10-19: 500 mg via ORAL
  Filled 2017-10-19: qty 1

## 2017-10-19 MED ORDER — BENZONATATE 100 MG PO CAPS: 100.0000 mg | ORAL_CAPSULE | Freq: Three times a day (TID) | ORAL | 0 refills | Status: AC | PRN

## 2017-10-19 MED ORDER — AZITHROMYCIN 500 MG PO TABS: 500.0000 mg | ORAL_TABLET | Freq: Every day | ORAL | 0 refills | Status: AC

## 2017-10-19 NOTE — ED Triage Notes (Signed)
Patient ambulatory to triage with steady gait, without difficulty or distress noted; pt reports cough & sinus congestion x 2 days

## 2017-10-19 NOTE — ED Provider Notes (Signed)
[facility] Emergency Department Provider Note   First MD Initiated Contact with Patient 10/19/17 0214     (approximate)  I have reviewed the triage vital signs and the nursing notes.   HISTORY  Chief Complaint Cough    HPI Natasha Wolfe is a 42 y.o. female below list of chronic medical conditions presents to the emergency department with 2-day history of cough and nasal congestion.  Patient denies any fever.  Patient does admit to smoking 5 cigarettes daily.   Past Medical History:  Diagnosis Date  . Anxiety   . Dysmenorrhea   . Fibroid   . Pelvic pain     Patient Active Problem List   Diagnosis Date Noted  . Status post laparoscopic hysterectomy 01/05/2017  . Menorrhagia with regular cycle 10/31/2016  . Fibroid uterus 10/31/2016  . Right lower quadrant abdominal pain 10/31/2016    Past Surgical History:  Procedure Laterality Date  . CYSTOSCOPY N/A 11/17/2016   Procedure: CYSTOSCOPY;  Surgeon: Will Bonnet, MD;  Location: [facility] ORS;  Service: Gynecology;  Laterality: N/A;  . HERNIA REPAIR    . LAPAROSCOPIC HYSTERECTOMY N/A 11/17/2016   Procedure: HYSTERECTOMY TOTAL LAPAROSCOPIC BILATERAL SALPINGECTOMY;  Surgeon: Will Bonnet, MD;  Location: [facility] ORS;  Service: Gynecology;  Laterality: N/A;  . LAPAROSCOPIC LYSIS OF ADHESIONS  11/17/2016   Procedure: LAPAROSCOPIC LYSIS OF ADHESIONS;  Surgeon: Will Bonnet, MD;  Location: [facility] ORS;  Service: Gynecology;;  . TUBAL LIGATION      Prior to Admission medications   Medication Sig Start Date End Date Taking? Authorizing Provider  ibuprofen (ADVIL,MOTRIN) 600 MG tablet Take 1 tablet (600 mg total) every 6 (six) hours as needed by mouth. 02/17/17   Laban Emperor, PA-C  ibuprofen (ADVIL,MOTRIN) 800 MG tablet Take 1 tablet (800 mg total) by mouth every 8 (eight) hours as needed for moderate pain. 08/05/17   Sable Feil, PA-C  traMADol (ULTRAM) 50 MG tablet Take 1 tablet (50 mg total) by  mouth every 12 (twelve) hours as needed. 08/05/17   Sable Feil, PA-C    Allergies No known drug allergies  Family History  Problem Relation Age of Onset  . Cancer Maternal Grandfather   . Breast cancer Neg Hx     Social History Social History   Tobacco Use  . Smoking status: Current Every Day Smoker    Packs/day: 0.50    Types: Cigarettes  . Smokeless tobacco: Never Used  Substance Use Topics  . Alcohol use: No  . Drug use: No    Review of Systems Constitutional: No fever/chills Eyes: No visual changes. ENT: No sore throat.  Positive for nasal congestion Cardiovascular: Denies chest pain. Respiratory: Denies shortness of breath.  Positive for cough Gastrointestinal: No abdominal pain.  No nausea, no vomiting.  No diarrhea.  No constipation. Genitourinary: Negative for dysuria. Musculoskeletal: Negative for neck pain.  Negative for back pain. Integumentary: Negative for rash. Neurological: Negative for headaches, focal weakness or numbness.   ____________________________________________   PHYSICAL EXAM:  VITAL SIGNS: ED Triage Vitals  Enc Vitals Group     BP 10/19/17 0020 (!) 141/85     Pulse Rate 10/19/17 0020 92     Resp 10/19/17 0020 18     Temp 10/19/17 0020 98.9 F (37.2 C)     Temp Source 10/19/17 0020 Oral     SpO2 10/19/17 0020 100 %     Weight 10/19/17 0019 95.3 kg (210 lb)  Height 10/19/17 0019 1.651 m (5\' 5" )     Head Circumference --      Peak Flow --      Pain Score 10/19/17 0019 0     Pain Loc --      Pain Edu? --      Excl. in [place]? --     Constitutional: Alert and oriented. Well appearing and in no acute distress. Eyes: Conjunctivae are normal.  Head: Atraumatic. Nose: No congestion/rhinnorhea. Mouth/Throat: Mucous membranes are moist.  Oropharynx non-erythematous. Neck: No stridor.  Cardiovascular: Normal rate, regular rhythm. Good peripheral circulation. Grossly normal heart sounds. Respiratory: Normal respiratory effort.  No  retractions. Lungs CTAB. Gastrointestinal: Soft and nontender. No distention.  Musculoskeletal: No lower extremity tenderness nor edema. No gross deformities of extremities. Neurologic:  Normal speech and language. No gross focal neurologic deficits are appreciated.  Skin:  Skin is warm, dry and intact. No rash noted. Psychiatric: Mood and affect are normal. Speech and behavior are normal.  __________________________  RADIOLOGY I, [place] Byrom, personally viewed and evaluated these images (plain radiographs) as part of my medical decision making, as well as reviewing the written report by the radiologist.  ED MD interpretation: Central bronchial thickening suggesting bronchitis.  Official radiology report(s): Dg Chest 2 View  Result Date: 10/19/2017 CLINICAL DATA:  Cough and congestion for 2 days. EXAM: CHEST - 2 VIEW COMPARISON:  Radiograph 09/17/2015 FINDINGS: The cardiomediastinal contours are normal. Central bronchial thickening. Previous right upper lobe nodular opacity not currently visualized. Pulmonary vasculature is normal. No consolidation, pleural effusion, or pneumothorax. No acute osseous abnormalities are seen. IMPRESSION: Central bronchial thickening suggesting bronchitis or reactive airways disease. Electronically Signed   By: Jeb Levering M.D.   On: 10/19/2017 03:14      Procedures   ____________________________________________   INITIAL IMPRESSION / ASSESSMENT AND PLAN / ED COURSE  As part of my medical decision making, I reviewed the following data within the electronic MEDICAL RECORD NUMBER   42 year old female presenting with above-stated history and physical exam concerning for pneumonia versus bronchitis chest x-ray consistent with bronchitis.  Patient given Tessalon Perles and azithromycin in the emergency department will be prescribed same for home. ____________________________________________  FINAL CLINICAL IMPRESSION(S) / ED DIAGNOSES  Final  diagnoses:  Acute bronchitis, unspecified organism     MEDICATIONS GIVEN DURING THIS VISIT:  Medications  benzonatate (TESSALON) capsule 100 mg (has no administration in time range)  azithromycin (ZITHROMAX) tablet 500 mg (has no administration in time range)     ED Discharge Orders    None       Note:  This document was prepared using Dragon voice recognition software and may include unintentional dictation errors.    Gregor Hams, MD 10/19/17 [phone]

## 2018-01-30 ENCOUNTER — Emergency Department: Payer: BLUE CROSS/BLUE SHIELD

## 2018-01-30 ENCOUNTER — Emergency Department
Admission: EM | Admit: 2018-01-30 | Discharge: 2018-01-30 | Disposition: A | Discharge status: Home / Self Care | Payer: BLUE CROSS/BLUE SHIELD | Attending: Emergency Medicine | Admitting: Emergency Medicine

## 2018-01-30 ENCOUNTER — Other Ambulatory Visit: Payer: Self-pay

## 2018-01-30 DIAGNOSIS — S99912A Unspecified injury of left ankle, initial encounter: Secondary | ICD-10-CM | POA: Diagnosis present

## 2018-01-30 DIAGNOSIS — Y9389 Activity, other specified: Secondary | ICD-10-CM | POA: Insufficient documentation

## 2018-01-30 DIAGNOSIS — Y999 Unspecified external cause status: Secondary | ICD-10-CM | POA: Diagnosis not present

## 2018-01-30 DIAGNOSIS — F17210 Nicotine dependence, cigarettes, uncomplicated: Secondary | ICD-10-CM | POA: Insufficient documentation

## 2018-01-30 DIAGNOSIS — Y929 Unspecified place or not applicable: Secondary | ICD-10-CM | POA: Insufficient documentation

## 2018-01-30 DIAGNOSIS — S93402A Sprain of unspecified ligament of left ankle, initial encounter: Secondary | ICD-10-CM | POA: Diagnosis not present

## 2018-01-30 DIAGNOSIS — W1842XA Slipping, tripping and stumbling without falling due to stepping into hole or opening, initial encounter: Secondary | ICD-10-CM | POA: Insufficient documentation

## 2018-01-30 MED ORDER — MELOXICAM 15 MG PO TABS: 15.0000 mg | ORAL_TABLET | Freq: Every day | ORAL | 2 refills | Status: AC

## 2018-01-30 NOTE — ED Triage Notes (Signed)
Rolled L ankle today. A&O, in wheelchair.

## 2018-01-30 NOTE — ED Provider Notes (Signed)
[facility] Emergency Department Provider Note  ____________________________________________   First MD Initiated Contact with Patient 01/30/18 1748     (approximate)  I have reviewed the triage vital signs and the nursing notes.   HISTORY  Chief Complaint Ankle Pain    HPI Natasha Wolfe is a 42 y.o. female resents emergency department after stepping a hole and twisting her left ankle today.  She states the pain is located at the outside of the ankle.  She denies any knee pain or foot pain.  She denies any other injuries.    Past Medical History:  Diagnosis Date  . Anxiety   . Dysmenorrhea   . Fibroid   . Pelvic pain     Patient Active Problem List   Diagnosis Date Noted  . Status post laparoscopic hysterectomy 01/05/2017  . Menorrhagia with regular cycle 10/31/2016  . Fibroid uterus 10/31/2016  . Right lower quadrant abdominal pain 10/31/2016    Past Surgical History:  Procedure Laterality Date  . CYSTOSCOPY N/A 11/17/2016   Procedure: CYSTOSCOPY;  Surgeon: Will Bonnet, MD;  Location: [facility] ORS;  Service: Gynecology;  Laterality: N/A;  . HERNIA REPAIR    . LAPAROSCOPIC HYSTERECTOMY N/A 11/17/2016   Procedure: HYSTERECTOMY TOTAL LAPAROSCOPIC BILATERAL SALPINGECTOMY;  Surgeon: Will Bonnet, MD;  Location: [facility] ORS;  Service: Gynecology;  Laterality: N/A;  . LAPAROSCOPIC LYSIS OF ADHESIONS  11/17/2016   Procedure: LAPAROSCOPIC LYSIS OF ADHESIONS;  Surgeon: Will Bonnet, MD;  Location: [facility] ORS;  Service: Gynecology;;  . TUBAL LIGATION      Prior to Admission medications   Medication Sig Start Date End Date Taking? Authorizing Provider  meloxicam (MOBIC) 15 MG tablet Take 1 tablet (15 mg total) by mouth daily. 01/30/18 01/30/19  Versie Starks, PA-C    Allergies Patient has no known allergies.  Family History  Problem Relation Age of Onset  . Cancer Maternal Grandfather   . Breast cancer Neg Hx     Social  History Social History   Tobacco Use  . Smoking status: Current Every Day Smoker    Packs/day: 0.50    Types: Cigarettes  . Smokeless tobacco: Never Used  Substance Use Topics  . Alcohol use: No  . Drug use: No    Review of Systems  Constitutional: No fever/chills Eyes: No visual changes. ENT: No sore throat. Respiratory: Denies cough Genitourinary: Negative for dysuria. Musculoskeletal: Negative for back pain.  Positive for left ankle pain Skin: Negative for rash.    ____________________________________________   PHYSICAL EXAM:  VITAL SIGNS: ED Triage Vitals [01/30/18 1740]  Enc Vitals Group     BP 117/75     Pulse Rate 97     Resp 16     Temp 98.4 F (36.9 C)     Temp Source Oral     SpO2 99 %     Weight 211 lb (95.7 kg)     Height 5\' 4"  (1.626 m)     Head Circumference      Peak Flow      Pain Score 8     Pain Loc      Pain Edu?      Excl. in [place]?     Constitutional: Alert and oriented. Well appearing and in no acute distress. Eyes: Conjunctivae are normal.  Head: Atraumatic. Nose: No congestion/rhinnorhea. Mouth/Throat: Mucous membranes are moist.   Neck:  supple no lymphadenopathy noted Cardiovascular: Normal rate, regular rhythm.  Respiratory: Normal respiratory effort.  No retractions GU: deferred Musculoskeletal: FROM all extremities, warm and well perfused.  Tenderness noted at the lateral malleolus of the left ankle.  Mild swelling is noted along the deltoid ligament.  The metatarsals are not tender.  The upper tib-fib is not tender.  Neurovascular is intact. Neurologic:  Normal speech and language.  Skin:  Skin is warm, dry and intact. No rash noted. Psychiatric: Mood and affect are normal. Speech and behavior are normal.  ____________________________________________   LABS (all labs ordered are listed, but only abnormal results are displayed)  Labs Reviewed - No data to  display ____________________________________________   ____________________________________________  RADIOLOGY  X-ray of the left ankle is negative for fracture  ____________________________________________   PROCEDURES  Procedure(s) performed: Ace wrap and stirrup splint were applied by the tech.  Patient does have crutches at home.  Procedures    ____________________________________________   INITIAL IMPRESSION / ASSESSMENT AND PLAN / ED COURSE  Pertinent labs & imaging results that were available during my care of the patient were reviewed by me and considered in my medical decision making (see chart for details).   Patient is a 42 year old female presents emergency department complaining of left ankle pain.  On physical exam the left ankle is tender at the lateral malleolus.  X-ray of the left ankle is negative for fracture.  Explained the findings to the patient.  She was placed in a Ace wrap and stirrup splint.  She is to use crutches she has at home.  Take meloxicam as needed for pain.  She is to elevate and ice the ankle.  She was given a work note.  She states she understands treatment plan will follow up with her regular doctor as needed.  She was discharged in stable condition.     As part of my medical decision making, I reviewed the following data within the [facility] notes reviewed and incorporated, Old chart reviewed, Radiograph reviewed x-ray of the left ankle is negative, Notes from prior ED visits and [place] Controlled Substance Database  ____________________________________________   FINAL CLINICAL IMPRESSION(S) / ED DIAGNOSES  Final diagnoses:  Sprain of left ankle, unspecified ligament, initial encounter      NEW MEDICATIONS STARTED DURING THIS VISIT:  Discharge Medication List as of 01/30/2018  6:41 PM    START taking these medications   Details  meloxicam (MOBIC) 15 MG tablet Take 1 tablet (15 mg total) by mouth  daily., Starting Tue 01/30/2018, Until Wed 01/30/2019, Normal         Note:  This document was prepared using Dragon voice recognition software and may include unintentional dictation errors.    Versie Starks, PA-C 01/30/18 2225    Arta Silence, MD 01/31/18 [phone]

## 2018-01-30 NOTE — Discharge Instructions (Addendum)
Follow-up with your regular doctor or Dr. Posey Pronto if not better in 5 7 days.  Use the meloxicam as instructed.  Elevate and ice the ankle.  Return if worsening.

## 2018-01-30 NOTE — ED Notes (Signed)
See triage note  Presents with left ankle pain  States she rolled her ankle   Swelling noted   Good pulses

## 2018-02-17 IMAGING — DX DG FINGER INDEX 2+V*R*
2 series · 2 of 2 positions shown · non-contrast
Comparison: None.

CLINICAL DATA: Pain and swelling of the index finger after crush
injury today.

EXAM:
RIGHT INDEX FINGER 2+V

[finger ap]
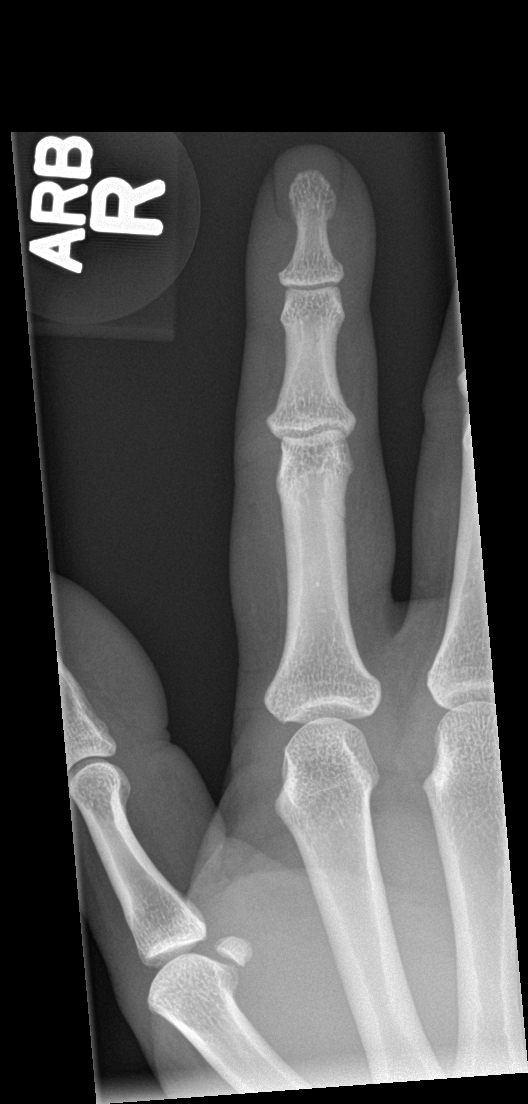

[finger lat]
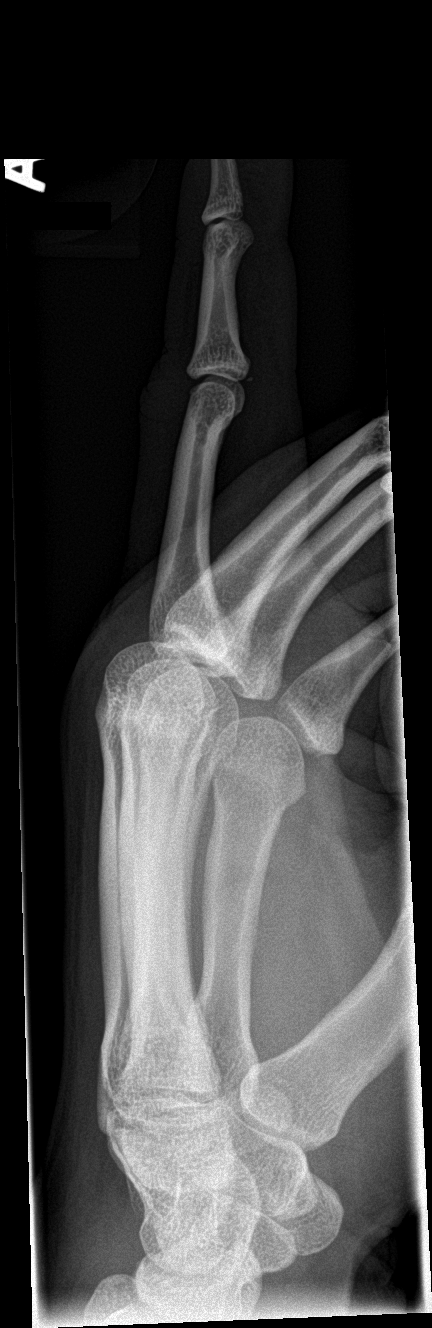

[2 of 2 positions shown; findings below may reference images not displayed]

FINDINGS: There is a tiny avulsion of bone from the volar aspect of the base
of the middle phalanx. The appearance is consistent with an old tiny
avulsion. No acute bone abnormality.
IMPRESSION: No acute abnormality. Old tiny avulsion from the volar plate of the
base of the middle phalanx.

## 2018-11-09 ENCOUNTER — Ambulatory Visit (LOCAL_COMMUNITY_HEALTH_CENTER): Payer: BC Managed Care – PPO

## 2018-11-09 ENCOUNTER — Other Ambulatory Visit: Payer: Self-pay

## 2018-11-09 DIAGNOSIS — Z0184 Encounter for antibody response examination: Secondary | ICD-10-CM

## 2018-11-09 DIAGNOSIS — Z23 Encounter for immunization: Secondary | ICD-10-CM | POA: Diagnosis not present

## 2018-11-09 NOTE — Progress Notes (Signed)
Pt states she has a history of chicken pox; sent for varicella titer today. ROI signed for titer results. Historical immunizations added to [facility]. MMR #2 given today.

## 2018-11-10 LAB — VARICELLA ZOSTER ANTIBODY, IGG: Varicella zoster IgG: 1501 index (ref >165)

## 2018-11-12 ENCOUNTER — Telehealth: Payer: Self-pay

## 2018-11-12 NOTE — Telephone Encounter (Signed)
TC with patient re: varicella titer.  Patient will pick up results from info booth Aileen Fass, RN

## 2019-08-16 ENCOUNTER — Ambulatory Visit
Admission: RE | Admit: 2019-08-16 | Discharge: 2019-08-16 | Disposition: A | Discharge status: Home / Self Care | Payer: No Typology Code available for payment source | Attending: Family Medicine | Admitting: Family Medicine

## 2019-08-16 ENCOUNTER — Other Ambulatory Visit: Payer: Self-pay | Admitting: Family Medicine

## 2019-08-16 ENCOUNTER — Ambulatory Visit
Admission: RE | Admit: 2019-08-16 | Discharge: 2019-08-16 | Disposition: A | Discharge status: Home / Self Care | Payer: No Typology Code available for payment source | Source: Ambulatory Visit | Attending: Family Medicine | Admitting: Family Medicine

## 2019-08-16 DIAGNOSIS — M216X1 Other acquired deformities of right foot: Secondary | ICD-10-CM | POA: Diagnosis not present

## 2019-08-16 DIAGNOSIS — M7660 Achilles tendinitis, unspecified leg: Secondary | ICD-10-CM | POA: Insufficient documentation

## 2019-10-05 ENCOUNTER — Encounter: Payer: Self-pay | Admitting: Emergency Medicine

## 2019-10-05 ENCOUNTER — Other Ambulatory Visit: Payer: Self-pay

## 2019-10-05 ENCOUNTER — Emergency Department
Admission: EM | Admit: 2019-10-05 | Discharge: 2019-10-05 | Disposition: A | Discharge status: Home / Self Care | Payer: BC Managed Care – PPO | Attending: Emergency Medicine | Admitting: Emergency Medicine

## 2019-10-05 DIAGNOSIS — Y999 Unspecified external cause status: Secondary | ICD-10-CM | POA: Diagnosis not present

## 2019-10-05 DIAGNOSIS — Y92410 Unspecified street and highway as the place of occurrence of the external cause: Secondary | ICD-10-CM | POA: Diagnosis not present

## 2019-10-05 DIAGNOSIS — M545 Low back pain: Secondary | ICD-10-CM | POA: Diagnosis not present

## 2019-10-05 DIAGNOSIS — Y939 Activity, unspecified: Secondary | ICD-10-CM | POA: Insufficient documentation

## 2019-10-05 DIAGNOSIS — F17210 Nicotine dependence, cigarettes, uncomplicated: Secondary | ICD-10-CM | POA: Insufficient documentation

## 2019-10-05 MED ORDER — CYCLOBENZAPRINE HCL 5 MG PO TABS: ORAL_TABLET | ORAL | 0 refills | Status: DC
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
## 2019-11-01 ENCOUNTER — Other Ambulatory Visit: Payer: Self-pay | Admitting: Physician Assistant

## 2019-11-01 DIAGNOSIS — Z1231 Encounter for screening mammogram for malignant neoplasm of breast: Secondary | ICD-10-CM

## 2019-11-08 ENCOUNTER — Ambulatory Visit: Payer: BC Managed Care – PPO | Attending: Internal Medicine

## 2019-11-08 DIAGNOSIS — Z23 Encounter for immunization: Secondary | ICD-10-CM

## 2019-11-08 NOTE — Progress Notes (Signed)
   Covid-19 Vaccination Clinic  Name:  Natasha Wolfe    MRN: [id number] DOB: 1975-05-03  11/08/2019  Natasha Wolfe was observed post Covid-19 immunization for 15 minutes without incident. She was provided with Vaccine Information Sheet and instruction to access the V-Safe system.   Natasha Wolfe was instructed to call 911 with any severe reactions post vaccine: Marland Kitchen Difficulty breathing  . Swelling of face and throat  . A fast heartbeat  . A bad rash all over body  . Dizziness and weakness   Immunizations Administered    Name Date Dose VIS Date Route   Pfizer COVID-19 Vaccine 11/08/2019 10:51 AM 0.3 mL 06/05/2018 Intramuscular   Manufacturer: [place]   Lot: [id number]   [place]: [id number]

## 2019-12-02 ENCOUNTER — Ambulatory Visit: Payer: BC Managed Care – PPO | Attending: Internal Medicine

## 2019-12-02 DIAGNOSIS — Z23 Encounter for immunization: Secondary | ICD-10-CM

## 2019-12-02 NOTE — Progress Notes (Signed)
   Covid-19 Vaccination Clinic  Name:  Vanessa Kampf    MRN: [id number] DOB: [birth year]  12/02/2019  Ms. Kesselman was observed post Covid-19 immunization for 15 minutes without incident. She was provided with Vaccine Information Sheet and instruction to access the V-Safe system.   Ms. Neidhardt was instructed to call 911 with any severe reactions post vaccine: Marland Kitchen Difficulty breathing  . Swelling of face and throat  . A fast heartbeat  . A bad rash all over body  . Dizziness and weakness   Immunizations Administered    Name Date Dose VIS Date Route   Pfizer COVID-19 Vaccine 12/02/2019  3:42 PM 0.3 mL 06/05/2018 Intramuscular   Manufacturer: [place]   Lot: [id number]   [place]: [id number]

## 2020-03-29 ENCOUNTER — Encounter: Payer: Self-pay | Admitting: Emergency Medicine

## 2020-03-29 ENCOUNTER — Emergency Department
Admission: EM | Admit: 2020-03-29 | Discharge: 2020-03-29 | Disposition: A | Discharge status: Home / Self Care | Payer: BC Managed Care – PPO | Attending: Emergency Medicine | Admitting: Emergency Medicine

## 2020-03-29 ENCOUNTER — Other Ambulatory Visit: Payer: Self-pay

## 2020-03-29 DIAGNOSIS — F17210 Nicotine dependence, cigarettes, uncomplicated: Secondary | ICD-10-CM | POA: Insufficient documentation

## 2020-03-29 DIAGNOSIS — J019 Acute sinusitis, unspecified: Secondary | ICD-10-CM | POA: Insufficient documentation

## 2020-03-29 DIAGNOSIS — R0981 Nasal congestion: Secondary | ICD-10-CM | POA: Diagnosis present

## 2020-03-29 DIAGNOSIS — Z20822 Contact with and (suspected) exposure to covid-19: Secondary | ICD-10-CM | POA: Diagnosis not present

## 2020-03-29 DIAGNOSIS — J0100 Acute maxillary sinusitis, unspecified: Secondary | ICD-10-CM

## 2020-03-29 LAB — RESP PANEL BY RT-PCR (FLU A&B, COVID) ARPGX2
Influenza A by PCR: NEGATIVE
Influenza B by PCR: NEGATIVE
SARS Coronavirus 2 by RT PCR: NEGATIVE

## 2020-03-29 MED ORDER — FLUTICASONE PROPIONATE 50 MCG/ACT NA SUSP: 2.0000 | Freq: Every day | NASAL | 0 refills | Status: AC

## 2020-03-29 MED ORDER — CETIRIZINE HCL 10 MG PO TABS: 10.0000 mg | ORAL_TABLET | Freq: Every day | ORAL | 0 refills | Status: AC

## 2020-03-29 MED ORDER — AMOXICILLIN-POT CLAVULANATE 875-125 MG PO TABS: 1.0000 | ORAL_TABLET | Freq: Two times a day (BID) | ORAL | 0 refills | Status: AC

## 2020-03-29 NOTE — ED Notes (Signed)
Patient denies pain and is resting comfortably.  

## 2020-03-29 NOTE — ED Triage Notes (Signed)
Pt reports for a few days has had cough and congestion and nasal congestion and fluids in her ears. No fevers

## 2020-03-29 NOTE — ED Provider Notes (Signed)
[facility] Emergency Department Provider Note  ____________________________________________  Time seen: Approximately 4:28 PM  I have reviewed the triage vital signs and the nursing notes.   HISTORY  Chief Complaint Nasal Congestion and Cough    HPI Natasha Wolfe is a 44 y.o. female that presents to the emergency department for evaluation of nasal congestion, facial pain, ear discomfort for 4 days.  Patient states that she feels pressure to both of her cheeks.  She does have postnasal drip, worse at night, which is causing her to cough.  She has had trouble with her sinuses before.  She has a history of allergies but does not take her allergy medicine.  She has taken Mucinex, which has helped.  No sick contacts.  She has been vaccinated for COVID-19.  No fever, shortness of breath, chest pain, vomiting, diarrhea.   Past Medical History:  Diagnosis Date  . Anxiety   . Dysmenorrhea   . Fibroid   . Pelvic pain     Patient Active Problem List   Diagnosis Date Noted  . Status post laparoscopic hysterectomy 01/05/2017  . Menorrhagia with regular cycle 10/31/2016  . Fibroid uterus 10/31/2016  . Right lower quadrant abdominal pain 10/31/2016    Past Surgical History:  Procedure Laterality Date  . CYSTOSCOPY N/A 11/17/2016   Procedure: CYSTOSCOPY;  Surgeon: Will Bonnet, MD;  Location: [facility] ORS;  Service: Gynecology;  Laterality: N/A;  . HERNIA REPAIR    . LAPAROSCOPIC HYSTERECTOMY N/A 11/17/2016   Procedure: HYSTERECTOMY TOTAL LAPAROSCOPIC BILATERAL SALPINGECTOMY;  Surgeon: Will Bonnet, MD;  Location: [facility] ORS;  Service: Gynecology;  Laterality: N/A;  . LAPAROSCOPIC LYSIS OF ADHESIONS  11/17/2016   Procedure: LAPAROSCOPIC LYSIS OF ADHESIONS;  Surgeon: Will Bonnet, MD;  Location: [facility] ORS;  Service: Gynecology;;  . TUBAL LIGATION      Prior to Admission medications   Medication Sig Start Date End Date Taking? Authorizing Provider   amoxicillin-clavulanate (AUGMENTIN) 875-125 MG tablet Take 1 tablet by mouth 2 (two) times daily for 10 days. 03/29/20 04/08/20  Laban Emperor, PA-C  cetirizine (ZYRTEC ALLERGY) 10 MG tablet Take 1 tablet (10 mg total) by mouth daily. 03/29/20 03/29/21  Laban Emperor, PA-C  cyclobenzaprine (FLEXERIL) 5 MG tablet Take 1-2 tablets 3 times daily as needed 10/05/19   Laban Emperor, PA-C  fluticasone [facility]) 50 MCG/ACT nasal spray Place 2 sprays into both nostrils daily. 03/29/20 03/29/21  Laban Emperor, PA-C    Allergies Patient has no known allergies.  Family History  Problem Relation Age of Onset  . Cancer Maternal Grandfather   . Breast cancer Neg Hx     Social History Social History   Tobacco Use  . Smoking status: Current Every Day Smoker    Packs/day: 0.50    Types: Cigarettes  . Smokeless tobacco: Never Used  Substance Use Topics  . Alcohol use: No  . Drug use: No     Review of Systems  Constitutional: No fever/chills Eyes: No visual changes. No discharge. ENT: Positive for congestion and rhinorrhea.  Positive for facial pain. Cardiovascular: No chest pain. Respiratory: Positive for cough. No SOB. Gastrointestinal: No abdominal pain.  No nausea, no vomiting.  No diarrhea.  No constipation. Musculoskeletal: Negative for musculoskeletal pain. Skin: Negative for rash, abrasions, lacerations, ecchymosis. Neurological: Negative for headaches.   ____________________________________________   PHYSICAL EXAM:  VITAL SIGNS: ED Triage Vitals  Enc Vitals Group     BP 03/29/20 1512 134/88     Pulse Rate  03/29/20 1512 (!) 105     Resp 03/29/20 1512 18     Temp 03/29/20 1512 98.8 F (37.1 C)     Temp Source 03/29/20 1512 Oral     SpO2 03/29/20 1512 98 %     Weight 03/29/20 1501 200 lb (90.7 kg)     Height 03/29/20 1501 5\' 3"  (1.6 m)     Head Circumference --      Peak Flow --      Pain Score 03/29/20 1500 0     Pain Loc --      Pain Edu? --      Excl. in [place]?  --      Constitutional: Alert and oriented. Well appearing and in no acute distress. Eyes: Conjunctivae are normal. PERRL. EOMI. No discharge. Head: Atraumatic. ENT: Maxillary sinus tenderness.      Ears: Tympanic membranes pearly gray with good landmarks. No discharge.      Nose: Mild congestion/rhinnorhea.        Mouth/Throat: Mucous membranes are moist. Oropharynx non-erythematous. Tonsils not enlarged. No exudates. Uvula midline. Neck: No stridor.   Hematological/Lymphatic/Immunilogical: No cervical lymphadenopathy. Cardiovascular: Normal rate, regular rhythm.  Good peripheral circulation. Respiratory: Normal respiratory effort without tachypnea or retractions. Lungs CTAB. Good air entry to the bases with no decreased or absent breath sounds. Gastrointestinal: Bowel sounds 4 quadrants. Soft and nontender to palpation. No guarding or rigidity. No palpable masses. No distention. Musculoskeletal: Full range of motion to all extremities. No gross deformities appreciated. Neurologic:  Normal speech and language. No gross focal neurologic deficits are appreciated.  Skin:  Skin is warm, dry and intact. No rash noted. Psychiatric: Mood and affect are normal. Speech and behavior are normal. Patient exhibits appropriate insight and judgement.   ____________________________________________   LABS (all labs ordered are listed, but only abnormal results are displayed)  Labs Reviewed  RESP PANEL BY RT-PCR (FLU A&B, COVID) ARPGX2   ____________________________________________  EKG   ____________________________________________  RADIOLOGY   No results found.  ____________________________________________    PROCEDURES  Procedure(s) performed:    Procedures    Medications - No data to display   ____________________________________________   INITIAL IMPRESSION / ASSESSMENT AND PLAN / ED COURSE  Pertinent labs & imaging results that were available during my care of the  patient were reviewed by me and considered in my medical decision making (see chart for details).  Review of the [place] CSRS was performed in accordance of the [place] prior to dispensing any controlled drugs.   Patient's diagnosis is consistent with sinusitis. Vital signs and exam are reassuring. Patient feels comfortable going home. Patient will be discharged home with prescriptions for Augmentin, flonase, zyrtec. Patient is to follow up with PCP as needed or otherwise directed. Patient is given ED precautions to return to the ED for any worsening or new symptoms.   Taylour Lietzke was evaluated in Emergency Department on 03/29/2020 for the symptoms described in the history of present illness. She was evaluated in the context of the global COVID-19 pandemic, which necessitated consideration that the patient might be at risk for infection with the SARS-CoV-2 virus that causes COVID-19. Institutional protocols and algorithms that pertain to the evaluation of patients at risk for COVID-19 are in a state of rapid change based on information released by regulatory bodies including the CDC and federal and state organizations. These policies and algorithms were followed during the patient's care in the ED.   ____________________________________________  FINAL CLINICAL IMPRESSION(S) / ED DIAGNOSES  Final diagnoses:  Acute maxillary sinusitis, recurrence not specified      NEW MEDICATIONS STARTED DURING THIS VISIT:  ED Discharge Orders         Ordered    fluticasone (FLONASE) 50 MCG/ACT nasal spray  Daily        03/29/20 1630    amoxicillin-clavulanate (AUGMENTIN) 875-125 MG tablet  2 times daily        03/29/20 1630    cetirizine (ZYRTEC ALLERGY) 10 MG tablet  Daily        03/29/20 1631              This chart was dictated using voice recognition software/Dragon. Despite best efforts to proofread, errors can occur which can change the meaning. Any change was purely unintentional.     Laban Emperor, PA-C 03/29/20 2036    Nance Pear, MD 03/29/20 2111

## 2020-04-06 ENCOUNTER — Other Ambulatory Visit: Payer: Self-pay

## 2020-04-06 ENCOUNTER — Emergency Department
Admission: EM | Admit: 2020-04-06 | Discharge: 2020-04-06 | Disposition: A | Discharge status: Home / Self Care | Payer: BC Managed Care – PPO | Attending: Emergency Medicine | Admitting: Emergency Medicine

## 2020-04-06 DIAGNOSIS — B349 Viral infection, unspecified: Secondary | ICD-10-CM | POA: Diagnosis not present

## 2020-04-06 DIAGNOSIS — Z20822 Contact with and (suspected) exposure to covid-19: Secondary | ICD-10-CM | POA: Insufficient documentation

## 2020-04-06 DIAGNOSIS — F17210 Nicotine dependence, cigarettes, uncomplicated: Secondary | ICD-10-CM | POA: Diagnosis not present

## 2020-04-06 DIAGNOSIS — R0981 Nasal congestion: Secondary | ICD-10-CM | POA: Diagnosis present

## 2020-04-06 LAB — RESP PANEL BY RT-PCR (FLU A&B, COVID) ARPGX2
Influenza A by PCR: NEGATIVE
Influenza B by PCR: NEGATIVE
SARS Coronavirus 2 by RT PCR: NEGATIVE

## 2020-04-06 NOTE — Discharge Instructions (Signed)
Follow up with primary care if not improving over the week.  Return to the ER for symptoms that change or worsen if unable to schedule an appointment.  Continue the Mucinex and finish your antibiotics. You should also continue the nasal spray and the allergy pill.

## 2020-04-06 NOTE — ED Provider Notes (Signed)
[facility] Emergency Department Provider Note ____________________________________________   Event Date/Time   First MD Initiated Contact with Patient 04/06/20 1150     (approximate)  I have reviewed the triage vital signs and the nursing notes.   HISTORY  Chief Complaint URI  HPI Sakeena Teall is a 44 y.o. female with history of allergies and sinus congestion/sinus infection presents to the emergency department for treatment and evaluation of sore throat and dry cough.  She states that she was taking Mucinex but when she started the antibiotic that was prescribed to her on her last visit she stopped taking the Mucinex.  Since stopping Mucinex, she feels that her chest has become increasingly congested.  She denies fever.  She has been exposed to COVID-19 since her last visit here..         Past Medical History:  Diagnosis Date  . Anxiety   . Dysmenorrhea   . Fibroid   . Pelvic pain     Patient Active Problem List   Diagnosis Date Noted  . Status post laparoscopic hysterectomy 01/05/2017  . Menorrhagia with regular cycle 10/31/2016  . Fibroid uterus 10/31/2016  . Right lower quadrant abdominal pain 10/31/2016    Past Surgical History:  Procedure Laterality Date  . CYSTOSCOPY N/A 11/17/2016   Procedure: CYSTOSCOPY;  Surgeon: Will Bonnet, MD;  Location: [facility] ORS;  Service: Gynecology;  Laterality: N/A;  . HERNIA REPAIR    . LAPAROSCOPIC HYSTERECTOMY N/A 11/17/2016   Procedure: HYSTERECTOMY TOTAL LAPAROSCOPIC BILATERAL SALPINGECTOMY;  Surgeon: Will Bonnet, MD;  Location: [facility] ORS;  Service: Gynecology;  Laterality: N/A;  . LAPAROSCOPIC LYSIS OF ADHESIONS  11/17/2016   Procedure: LAPAROSCOPIC LYSIS OF ADHESIONS;  Surgeon: Will Bonnet, MD;  Location: [facility] ORS;  Service: Gynecology;;  . TUBAL LIGATION      Prior to Admission medications   Medication Sig Start Date End Date Taking? Authorizing Provider  amoxicillin-clavulanate  (AUGMENTIN) 875-125 MG tablet Take 1 tablet by mouth 2 (two) times daily for 10 days. 03/29/20 04/08/20  Laban Emperor, PA-C  cetirizine (ZYRTEC ALLERGY) 10 MG tablet Take 1 tablet (10 mg total) by mouth daily. 03/29/20 03/29/21  Laban Emperor, PA-C  cyclobenzaprine (FLEXERIL) 5 MG tablet Take 1-2 tablets 3 times daily as needed 10/05/19   Laban Emperor, PA-C  fluticasone [facility]) 50 MCG/ACT nasal spray Place 2 sprays into both nostrils daily. 03/29/20 03/29/21  Laban Emperor, PA-C    Allergies Patient has no known allergies.  Family History  Problem Relation Age of Onset  . Cancer Maternal Grandfather   . Breast cancer Neg Hx     Social History Social History   Tobacco Use  . Smoking status: Current Every Day Smoker    Packs/day: 0.50    Types: Cigarettes  . Smokeless tobacco: Never Used  Substance Use Topics  . Alcohol use: No  . Drug use: No    Review of Systems  Constitutional: No fever/chills Eyes: No visual changes. ENT: No sore throat. Cardiovascular: Denies chest pain. Respiratory: Denies shortness of breath.  Positive for cough Gastrointestinal: No abdominal pain.  No nausea, no vomiting.  No diarrhea.  No constipation. Genitourinary: Negative for dysuria. Musculoskeletal: Negative for back pain. Skin: Negative for rash. Neurological: Negative for headaches, focal weakness or numbness.  ____________________________________________   PHYSICAL EXAM:  VITAL SIGNS: ED Triage Vitals  Enc Vitals Group     BP 04/06/20 1014 (!) 135/98     Pulse Rate 04/06/20 1014 84  Resp 04/06/20 1014 18     Temp 04/06/20 1014 98.3 F (36.8 C)     Temp Source 04/06/20 1014 Oral     SpO2 04/06/20 1014 99 %     Weight 04/06/20 1015 200 lb (90.7 kg)     Height 04/06/20 1015 5\' 3"  (1.6 m)     Head Circumference --      Peak Flow --      Pain Score 04/06/20 1014 0     Pain Loc --      Pain Edu? --      Excl. in [place]? --     Constitutional: Alert and oriented. Well  appearing and in no acute distress. Eyes: Conjunctivae are normal. PERRL. EOMI. Head: Atraumatic. Nose: No congestion/rhinnorhea. Mouth/Throat: Mucous membranes are moist.  Oropharynx non-erythematous. Neck: No stridor.   Hematological/Lymphatic/Immunilogical: No cervical lymphadenopathy. Cardiovascular: Normal rate, regular rhythm. Grossly normal heart sounds.  Good peripheral circulation. Respiratory: Normal respiratory effort.  No retractions. Lungs CTAB. Gastrointestinal: Soft and nontender. No distention. No abdominal bruits. Genitourinary:  Musculoskeletal: No lower extremity tenderness nor edema.  No joint effusions. Neurologic:  Normal speech and language. No gross focal neurologic deficits are appreciated. No gait instability. Skin:  Skin is warm, dry and intact. No rash noted. Psychiatric: Mood and affect are normal. Speech and behavior are normal.  ____________________________________________   LABS (all labs ordered are listed, but only abnormal results are displayed)  Labs Reviewed  RESP PANEL BY RT-PCR (FLU A&B, COVID) ARPGX2   ____________________________________________  EKG  Not indicated ____________________________________________  RADIOLOGY  ED MD interpretation:    Not indicated I, Sherrie George, personally viewed and evaluated these images (plain radiographs) as part of my medical decision making, as well as reviewing the written report by the radiologist.  Official radiology report(s): No results found.  ____________________________________________   PROCEDURES  Procedure(s) performed (including Critical Care):  Procedures  ____________________________________________   INITIAL IMPRESSION / ASSESSMENT AND PLAN     44 year old female presenting to the emergency department for treatment and evaluation of chest congestion and cough.  See HPI for further details.  Based on exam and symptoms, no imaging is needed today.  Breath sounds are  clear.  She will be advised to restart Mucinex and continue taking the Augmentin nasal spray, and allergy medication as advised.  She is to follow-up with her primary care provider if not improving over the week.  For symptoms of concern if she is unable to see primary care she is to return to the emergency department.    ___________________________________________   FINAL CLINICAL IMPRESSION(S) / ED DIAGNOSES  Final diagnoses:  Acute viral syndrome  Close exposure to COVID-19 virus     ED Discharge Orders    None       Rayshawn Hays was evaluated in Emergency Department on 04/06/2020 for the symptoms described in the history of present illness. She was evaluated in the context of the global COVID-19 pandemic, which necessitated consideration that the patient might be at risk for infection with the SARS-CoV-2 virus that causes COVID-19. Institutional protocols and algorithms that pertain to the evaluation of patients at risk for COVID-19 are in a state of rapid change based on information released by regulatory bodies including the CDC and federal and state organizations. These policies and algorithms were followed during the patient's care in the ED.   Note:  This document was prepared using Dragon voice recognition software and may include unintentional dictation errors.   Nickoles Gregori  B, FNP 04/06/20 1412    Carrie Mew, MD 04/06/20 1606

## 2020-04-06 NOTE — ED Triage Notes (Signed)
Pt c/o cough with chest and sinus congestion for over a week, states she was seen and treated for a sinus infection a week ago

## 2020-09-13 ENCOUNTER — Emergency Department
Admission: EM | Admit: 2020-09-13 | Discharge: 2020-09-13 | Disposition: A | Discharge status: Home / Self Care | Payer: 59 | Attending: Emergency Medicine | Admitting: Emergency Medicine

## 2020-09-13 ENCOUNTER — Other Ambulatory Visit: Payer: Self-pay

## 2020-09-13 ENCOUNTER — Emergency Department: Payer: 59

## 2020-09-13 DIAGNOSIS — F17210 Nicotine dependence, cigarettes, uncomplicated: Secondary | ICD-10-CM | POA: Diagnosis not present

## 2020-09-13 DIAGNOSIS — W231XXA Caught, crushed, jammed, or pinched between stationary objects, initial encounter: Secondary | ICD-10-CM | POA: Diagnosis not present

## 2020-09-13 DIAGNOSIS — S60212A Contusion of left wrist, initial encounter: Secondary | ICD-10-CM | POA: Insufficient documentation

## 2020-09-13 DIAGNOSIS — M25532 Pain in left wrist: Secondary | ICD-10-CM | POA: Diagnosis not present

## 2020-09-13 DIAGNOSIS — S6992XA Unspecified injury of left wrist, hand and finger(s), initial encounter: Secondary | ICD-10-CM | POA: Diagnosis present

## 2020-09-13 MED ORDER — MELOXICAM 15 MG PO TABS
15.0000 mg | ORAL_TABLET | Freq: Every day | ORAL | 0 refills | Status: AC
Start: 2020-09-13 — End: 2020-09-28

## 2020-09-13 MED ORDER — ACETAMINOPHEN 325 MG PO TABS
650.0000 mg | ORAL_TABLET | Freq: Once | ORAL | Status: AC
  Administered 2020-09-13: 650 mg via ORAL
  Filled 2020-09-13: qty 2

## 2020-09-13 MED ORDER — HYDROCODONE-ACETAMINOPHEN 5-325 MG PO TABS
1.0000 | ORAL_TABLET | Freq: Once | ORAL | Status: AC
Start: 2020-09-13 — End: 2020-09-13
  Administered 2020-09-13: 1 via ORAL
  Filled 2020-09-13: qty 1

## 2020-09-13 MED ORDER — MELOXICAM 7.5 MG PO TABS
15.0000 mg | ORAL_TABLET | Freq: Once | ORAL | Status: AC
  Administered 2020-09-13: 15 mg via ORAL
  Filled 2020-09-13: qty 2
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
## 2020-09-13 NOTE — Discharge Instructions (Addendum)
Please use Mobic as prescribed. You may also use Tylenol, up to 1000mg  4x daily. Follow up with orthopedics if not improved in 1-2 weeks.

## 2021-05-10 DIAGNOSIS — M25512 Pain in left shoulder: Secondary | ICD-10-CM | POA: Diagnosis not present

## 2021-05-24 DIAGNOSIS — M25512 Pain in left shoulder: Secondary | ICD-10-CM | POA: Diagnosis not present

## 2021-07-04 ENCOUNTER — Emergency Department
Admission: EM | Admit: 2021-07-04 | Discharge: 2021-07-04 | Disposition: A | Discharge status: Home / Self Care | Payer: 59 | Attending: Emergency Medicine | Admitting: Emergency Medicine

## 2021-07-04 ENCOUNTER — Other Ambulatory Visit: Payer: Self-pay

## 2021-07-04 ENCOUNTER — Encounter: Payer: Self-pay | Admitting: Emergency Medicine

## 2021-07-04 DIAGNOSIS — L02412 Cutaneous abscess of left axilla: Secondary | ICD-10-CM | POA: Insufficient documentation

## 2021-07-04 DIAGNOSIS — M79602 Pain in left arm: Secondary | ICD-10-CM | POA: Diagnosis present

## 2021-07-04 MED ORDER — ACETAMINOPHEN 500 MG PO TABS
1000.0000 mg | ORAL_TABLET | Freq: Once | ORAL | Status: AC
  Administered 2021-07-04: 1000 mg via ORAL
  Filled 2021-07-04: qty 2

## 2021-07-04 MED ORDER — DOXYCYCLINE HYCLATE 100 MG PO TABS
100.0000 mg | ORAL_TABLET | Freq: Once | ORAL | Status: AC
  Administered 2021-07-04: 100 mg via ORAL
  Filled 2021-07-04: qty 1

## 2021-07-04 MED ORDER — NAPROXEN 500 MG PO TABS
500.0000 mg | ORAL_TABLET | Freq: Once | ORAL | Status: AC
  Administered 2021-07-04: 500 mg via ORAL
  Filled 2021-07-04: qty 1

## 2021-07-04 MED ORDER — DOXYCYCLINE HYCLATE 100 MG PO TABS: 100.0000 mg | ORAL_TABLET | Freq: Two times a day (BID) | ORAL | 0 refills | Status: AC

## 2021-07-04 NOTE — Discharge Instructions (Addendum)
You are being discharged with a prescription for doxycycline antibiotic to take twice daily for the next week to treat the abscess to your left armpit. ? ?Please finish all of these antibiotics, even if the spot is looking better. ? ?Please take Tylenol and ibuprofen/Advil for your pain.  It is safe to take them together, or to alternate them every few hours.  Take up to '1000mg'$  of Tylenol at a time, up to 4 times per day.  Do not take more than 4000 mg of Tylenol in 24 hours.  For ibuprofen, take 400-600 mg, 4-5 times per day. ? ?The small strip of packing that was left in place is to keep the skin open so it can heal from the inside out.  Try not to preemptively pull it out.  It will naturally fall out after a few days. ?

## 2021-07-04 NOTE — ED Provider Notes (Signed)
? ?[facility] ?Provider Note ? ? ? Event Date/Time  ? First MD Initiated Contact with Patient 07/04/21 [phone]   ?  (approximate) ? ? ?History  ? ?Abscess ? ? ?HPI ? ?Natasha Wolfe is a 46 y.o. female who presents to the ED for evaluation of Abscess ?  ?Obese patient.  No documented history of hidradenitis suppurativa ? ?Patient presents to the ED for evaluation of a couple days of increasing pain to her left armpit with associated abscess that is not draining.  Denies systemic symptoms such as fevers, nausea.  No recent antibiotics.  No trauma or injuries.  No weakness or sensation changes to the left arm/hand. ? ?Physical Exam  ? ?Triage Vital Signs: ?ED Triage Vitals  ?Enc Vitals Group  ?   BP 07/04/21 0611 140/88  ?   Pulse Rate 07/04/21 0611 91  ?   Resp 07/04/21 0611 20  ?   Temp 07/04/21 0611 98.5 ?F (36.9 ?C)  ?   Temp Source 07/04/21 0611 Oral  ?   SpO2 07/04/21 0611 99 %  ?   Weight 07/04/21 0609 212 lb (96.2 kg)  ?   Height 07/04/21 0609 '5\' 3"'$  (1.6 m)  ?   Head Circumference --   ?   Peak Flow --   ?   Pain Score 07/04/21 0608 7  ?   Pain Loc --   ?   Pain Edu? --   ?   Excl. in [place]? --   ? ? ?Most recent vital signs: ?Vitals:  ? 07/04/21 0611  ?BP: 140/88  ?Pulse: 91  ?Resp: 20  ?Temp: 98.5 ?F (36.9 ?C)  ?SpO2: 99%  ? ? ?General: Awake, no distress.  Obese.  Appears uncomfortable. ?CV:  Good peripheral perfusion.  ?Resp:  Normal effort.  ?Abd:  No distention.  ?MSK:  No deformity noted.  Abducts her left arm independently and has an obvious superficial abscess to the superior aspect of the left axilla, about 2cm in diameter.  Coming to ahead, but not draining.  No significant surrounding erythema or induration to suggest associated cellulitis. ?Neuro:  No focal deficits appreciated. ?Other:   ? ? ?ED Results / Procedures / Treatments  ? ?Labs ?(all labs ordered are listed, but only abnormal results are displayed) ?Labs Reviewed - No data to display ? ?EKG ? ? ?RADIOLOGY ? ? ?Official  radiology report(s): ?No results found. ? ?PROCEDURES and INTERVENTIONS: ? ?Marland Kitchen.Incision and Drainage ? ?Date/Time: 07/04/2021 8:09 AM ?Performed by: Vladimir Crofts, MD ?Authorized by: Vladimir Crofts, MD  ? ?Consent:  ?  Consent obtained:  Verbal ?  Consent given by:  Patient ?  Risks, benefits, and alternatives were discussed: yes   ?Location:  ?  Type:  Abscess ?  Size:  2 ?  Location: left axilla. ?Procedure type:  ?  Complexity:  Simple ?Procedure details:  ?  Incision types:  Stab incision ?  Drainage:  Bloody and purulent ?  Drainage amount:  Scant ?  Wound treatment:  Drain placed ?  Packing materials:  1/2 in iodoform gauze ?  Amount 1/2" iodoform:  3cm ?Post-procedure details:  ?  Procedure completion:  Tolerated well, no immediate complications ? ?Medications  ?doxycycline (VIBRA-TABS) tablet 100 mg (100 mg Oral Given 07/04/21 0749)  ?acetaminophen (TYLENOL) tablet 1,000 mg (1,000 mg Oral Given 07/04/21 0749)  ?naproxen (NAPROSYN) tablet 500 mg (500 mg Oral Given 07/04/21 0749)  ? ? ? ?IMPRESSION / MDM / ASSESSMENT AND PLAN /  ED COURSE  ?I reviewed the triage vital signs and the nursing notes. ? ?46 year old female presents to the ED with an uncomplicated superficial abscess to the skin of the left axilla.  She looks well without systemic symptoms, signs of sepsis.  No signs of surrounding cellulitis.  No evidence of neurovascular impingement.  No indications for diagnostics.  I&D performed by me with an 11 blade scalpel, draining bloody and purulent material.  Significant improvement of pain after this.  Placed a small amount of packing and started the patient on doxycycline.  We will discharge with return precautions. ? ?Clinical Course as of 07/04/21 0811  ?Sun Jul 04, 2021  ?0803 I&D performed, well tolerated. Pain drastically improved. We discussed care at home and return precautions [DS]  ?  ?Clinical Course User Index ?[DS] Vladimir Crofts, MD  ? ? ? ?FINAL CLINICAL IMPRESSION(S) / ED DIAGNOSES  ? ?Final  diagnoses:  ?Abscess of left axilla  ? ? ? ?Rx / DC Orders  ? ?ED Discharge Orders   ? ?      Ordered  ?  doxycycline (VIBRA-TABS) 100 MG tablet  2 times daily       ? 07/04/21 0726  ? ?  ?  ? ?  ? ? ? ?Note:  This document was prepared using Dragon voice recognition software and may include unintentional dictation errors. ?  ?Vladimir Crofts, MD ?07/04/21 [phone] ? ?

## 2021-07-04 NOTE — ED Triage Notes (Signed)
Pt c/o abscess to L axilla, pt states has been there x several days, pt with noted head to abscess, no draining noted at this time.  ?

## 2021-07-22 ENCOUNTER — Emergency Department
Admission: EM | Admit: 2021-07-22 | Discharge: 2021-07-22 | Discharge status: Against Medical Advice | Payer: 59 | Source: Home / Self Care

## 2021-07-22 DIAGNOSIS — L02412 Cutaneous abscess of left axilla: Secondary | ICD-10-CM | POA: Diagnosis not present

## 2021-07-22 DIAGNOSIS — R2991 Unspecified symptoms and signs involving the musculoskeletal system: Secondary | ICD-10-CM | POA: Diagnosis not present

## 2021-07-22 DIAGNOSIS — Z041 Encounter for examination and observation following transport accident: Secondary | ICD-10-CM | POA: Diagnosis not present

## 2021-07-23 ENCOUNTER — Emergency Department
Admission: EM | Admit: 2021-07-23 | Discharge: 2021-07-23 | Disposition: A | Discharge status: Home / Self Care | Payer: 59 | Attending: Emergency Medicine | Admitting: Emergency Medicine

## 2021-07-23 ENCOUNTER — Encounter: Payer: Self-pay | Admitting: Emergency Medicine

## 2021-07-23 ENCOUNTER — Other Ambulatory Visit: Payer: Self-pay

## 2021-07-23 DIAGNOSIS — Z041 Encounter for examination and observation following transport accident: Secondary | ICD-10-CM | POA: Diagnosis not present

## 2021-07-23 DIAGNOSIS — Y92410 Unspecified street and highway as the place of occurrence of the external cause: Secondary | ICD-10-CM | POA: Diagnosis not present

## 2021-07-23 DIAGNOSIS — M25512 Pain in left shoulder: Secondary | ICD-10-CM | POA: Diagnosis not present

## 2021-07-23 DIAGNOSIS — M7918 Myalgia, other site: Secondary | ICD-10-CM

## 2021-07-23 DIAGNOSIS — M791 Myalgia, unspecified site: Secondary | ICD-10-CM | POA: Insufficient documentation

## 2021-07-23 DIAGNOSIS — R2991 Unspecified symptoms and signs involving the musculoskeletal system: Secondary | ICD-10-CM | POA: Diagnosis not present

## 2021-07-23 DIAGNOSIS — L02412 Cutaneous abscess of left axilla: Secondary | ICD-10-CM | POA: Diagnosis not present

## 2021-07-23 MED ORDER — CYCLOBENZAPRINE HCL 5 MG PO TABS: 5.0000 mg | ORAL_TABLET | Freq: Three times a day (TID) | ORAL | 0 refills | Status: AC | PRN

## 2021-07-23 MED ORDER — IBUPROFEN 800 MG PO TABS: 800.0000 mg | ORAL_TABLET | Freq: Three times a day (TID) | ORAL | 0 refills | Status: AC | PRN
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
## 2021-08-18 ENCOUNTER — Other Ambulatory Visit: Payer: Self-pay | Admitting: Nurse Practitioner

## 2021-08-18 ENCOUNTER — Ambulatory Visit
Admission: RE | Admit: 2021-08-18 | Discharge: 2021-08-18 | Disposition: A | Discharge status: Home / Self Care | Payer: Worker's Compensation | Source: Ambulatory Visit | Attending: Nurse Practitioner | Admitting: Nurse Practitioner

## 2021-08-18 ENCOUNTER — Ambulatory Visit
Admission: RE | Admit: 2021-08-18 | Discharge: 2021-08-18 | Disposition: A | Discharge status: Home / Self Care | Payer: 59 | Source: Ambulatory Visit | Attending: Nurse Practitioner | Admitting: Nurse Practitioner

## 2021-08-18 DIAGNOSIS — M25531 Pain in right wrist: Secondary | ICD-10-CM | POA: Diagnosis not present

## 2021-08-18 DIAGNOSIS — S52611A Displaced fracture of right ulna styloid process, initial encounter for closed fracture: Secondary | ICD-10-CM | POA: Diagnosis not present

## 2021-11-18 ENCOUNTER — Encounter: Payer: Self-pay | Admitting: Emergency Medicine

## 2021-11-18 ENCOUNTER — Other Ambulatory Visit: Payer: Self-pay

## 2021-11-18 ENCOUNTER — Emergency Department
Admission: EM | Admit: 2021-11-18 | Discharge: 2021-11-18 | Disposition: A | Discharge status: Home / Self Care | Payer: 59 | Attending: Emergency Medicine | Admitting: Emergency Medicine

## 2021-11-18 DIAGNOSIS — R21 Rash and other nonspecific skin eruption: Secondary | ICD-10-CM | POA: Diagnosis not present

## 2021-11-18 DIAGNOSIS — H1032 Unspecified acute conjunctivitis, left eye: Secondary | ICD-10-CM | POA: Diagnosis not present

## 2021-11-18 DIAGNOSIS — H1089 Other conjunctivitis: Secondary | ICD-10-CM | POA: Insufficient documentation

## 2021-11-18 MED ORDER — CLOTRIMAZOLE-BETAMETHASONE 1-0.05 % EX CREA
1.0000 | TOPICAL_CREAM | Freq: Two times a day (BID) | CUTANEOUS | 0 refills | Status: AC
Start: 2021-11-18 — End: ?

## 2021-11-18 MED ORDER — ERYTHROMYCIN 5 MG/GM OP OINT: 1.0000 | TOPICAL_OINTMENT | Freq: Every day | OPHTHALMIC | 0 refills | Status: AC

## 2021-11-18 NOTE — ED Triage Notes (Signed)
Pt to ED via POV for rash in her underarm area x 3 days. Pt is in NAD

## 2021-11-18 NOTE — Discharge Instructions (Addendum)
Use the creams as prescribed.  Return for any new, worsening, or change in symptoms or other concerns.  It is a pleasure caring for you today.

## 2021-11-18 NOTE — ED Provider Notes (Signed)
[facility] Provider Note    Event Date/Time   First MD Initiated Contact with Patient 11/18/21 1741     (approximate)   History   Rash   HPI  Natasha Wolfe is a 46 y.o. female who presents today for evaluation of rash.  Patient reports that this began over the last couple of days.  She attributes her symptoms to using a new "cheap" deodorant.  However it is in her right armpit only.  She also noticed a similar rash beneath bilateral breasts.  She reports that both rashes are very itchy.  She denies any pain.  No fevers or chills.  No swelling.  On exam, I noticed that she has left eye conjunctivitis.  She notes that she has been itching this eye from her allergies.  She denies any pain, vision changes, pain with moving her eyes, or any other concerns.  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 --      Excl. in [place]? --     Most recent vital signs: Vitals:   11/18/21 1642  BP: (!) 135/98  Pulse: 76  Resp: 16  Temp: 98.6 F (37 C)  SpO2: 98%    Physical Exam Vitals and nursing note reviewed.  Constitutional:      General: Awake and alert. No acute distress.    Appearance: Normal appearance. The patient is normal weight.  HENT:     Head: Normocephalic and atraumatic.     Mouth: Mucous membranes are moist.  Eyes:     General: PERRL. Normal EOMs        Right eye:  No discharge.        Left eye: Mild mucopurulent discharge in the medial corner of the eye.  No hyphema or hypopyon.  Normal extraocular movements.  No eyelid edema or periorbital edema/ecchymosis. Cardiovascular:     Rate and Rhythm: Normal rate and regular rhythm.     Pulses: Normal pulses.     Heart sounds: Normal heart sounds Pulmonary:     Effort: Pulmonary effort is normal. No respiratory distress.     Breath sounds: Normal breath sounds.  Abdominal:     Abdomen is soft. There is no abdominal tenderness. No rebound or guarding. No distention. Musculoskeletal:        General: No swelling. Normal range of motion.     Cervical back: Normal range of motion and neck supple.  Skin:    General: Skin is warm and dry.     Capillary Refill: Capillary refill takes less than 2 seconds.  Findings: Rash in right axilla with satellite lesions.  Similar-appearing rash beneath bilateral breasts with satellite lesions.  Nontender to palpation.  No swelling or fluctuance. Neurological:     Mental Status: The patient is awake and alert.      ED Results / Procedures / Treatments   Labs (all labs ordered are listed, but only abnormal results are displayed) Labs Reviewed - No data to display   EKG     RADIOLOGY     PROCEDURES:  Critical Care performed:   Procedures   MEDICATIONS ORDERED IN ED: Medications - No data to display   IMPRESSION / MDM / [place] / ED COURSE  I reviewed the triage vital signs and the nursing notes.   Differential diagnosis includes, but is not limited to, contact dermatitis, fungal intertrigo, do not suspect cellulitis given lack of pain, fever, or constitutional symptoms.  The rash beneath her breasts looks like intertrigo and she was prescribed clotrimazole for this.  She reports that her armpit rash feels exactly the same, and there are also satellite lesions to suggest intertrigo.  She was prescribed topical clotrimazole-betamethasone  cream to use in these areas. As for her eye, symptoms are consistent with possible bacterial conjunctivitis.  No hyphema, hypopyon, visual changes, or pain to suggest more ominous cause.  She was started on erythromycin.  We discussed return precautions and outpatient follow-up.  Patient understands and agrees with plan.  Discharged in stable condition.   Patient's presentation is most consistent with acute, uncomplicated illness.      FINAL CLINICAL IMPRESSION(S) / ED DIAGNOSES   Final diagnoses:  Rash and nonspecific skin eruption  Acute bacterial conjunctivitis of left eye     Rx / DC Orders   ED Discharge Orders          Ordered    clotrimazole-betamethasone (LOTRISONE) cream  2 times daily        11/18/21 1751    erythromycin ophthalmic ointment  Daily at bedtime        11/18/21 1758             Note:  This document was prepared using Dragon voice recognition software and may include unintentional dictation errors.   Emeline Gins 11/18/21 2058    Blake Divine, MD 11/20/21 [phone]

## 2022-08-14 ENCOUNTER — Emergency Department: Payer: 59

## 2022-08-14 ENCOUNTER — Other Ambulatory Visit: Payer: Self-pay

## 2022-08-14 ENCOUNTER — Emergency Department
Admission: EM | Admit: 2022-08-14 | Discharge: 2022-08-15 | Disposition: A | Discharge status: Home / Self Care | Payer: 59 | Attending: Emergency Medicine | Admitting: Emergency Medicine

## 2022-08-14 DIAGNOSIS — R000 Tachycardia, unspecified: Secondary | ICD-10-CM | POA: Diagnosis not present

## 2022-08-14 DIAGNOSIS — R1110 Vomiting, unspecified: Secondary | ICD-10-CM | POA: Diagnosis not present

## 2022-08-14 DIAGNOSIS — R1012 Left upper quadrant pain: Secondary | ICD-10-CM

## 2022-08-14 LAB — COMPREHENSIVE METABOLIC PANEL
ALT: 11 U/L (ref 0–44)
AST: 14 U/L — ABNORMAL LOW (ref 15–41)
Albumin: 3.9 g/dL (ref 3.5–5.0)
Alkaline Phosphatase: 55 U/L (ref 38–126)
Anion gap: 8 (ref 5–15)
BUN: 11 mg/dL (ref 6–20)
CO2: 22 mmol/L (ref 22–32)
Calcium: 9.1 mg/dL (ref 8.9–10.3)
Chloride: 108 mmol/L (ref 98–111)
Creatinine, Ser: 0.97 mg/dL (ref 0.44–1.00)
GFR, Estimated: >60 mL/min (ref >60)
Glucose, Bld: 124 mg/dL — ABNORMAL HIGH (ref 70–99)
Potassium: 3.7 mmol/L (ref 3.5–5.1)
Sodium: 138 mmol/L (ref 135–145)
Total Bilirubin: 0.5 mg/dL (ref 0.3–1.2)
Total Protein: 7.6 g/dL (ref 6.5–8.1)

## 2022-08-14 LAB — CBC
HCT: 41.2 % (ref 36.0–46.0)
Hemoglobin: 13.5 g/dL (ref 12.0–15.0)
MCH: 25.7 pg — ABNORMAL LOW (ref 26.0–34.0)
MCHC: 32.8 g/dL (ref 30.0–36.0)
MCV: 78.5 fL — ABNORMAL LOW (ref 80.0–100.0)
Platelets: 338 10*3/uL (ref 150–400)
RBC: 5.25 MIL/uL — ABNORMAL HIGH (ref 3.87–5.11)
RDW: 13.9 % (ref 11.5–15.5)
WBC: 8.9 10*3/uL (ref 4.0–10.5)
nRBC: 0 % (ref 0.0–0.2)

## 2022-08-14 LAB — LIPASE, BLOOD: Lipase: 53 U/L — ABNORMAL HIGH (ref 11–51)

## 2022-08-14 LAB — TROPONIN I (HIGH SENSITIVITY): Troponin I (High Sensitivity): 3 ng/L (ref <18)

## 2022-08-14 MED ORDER — IOHEXOL 300 MG/ML  SOLN
100.0000 mL | Freq: Once | INTRAMUSCULAR | Status: AC | PRN
Start: 2022-08-14 — End: 2022-08-14
  Administered 2022-08-14: 100 mL via INTRAVENOUS

## 2022-08-14 NOTE — ED Triage Notes (Signed)
Pt c/o dull LUQ pain since last night. Pt threw up once last night. Movement increases pain, pt states she may have pulled a muscle. Pt denies SHOB, CP, dizziness.

## 2022-08-14 NOTE — ED Provider Notes (Signed)
[facility] Provider Note    Event Date/Time   First MD Initiated Contact with Patient 08/14/22 2325     (approximate)   History   Abdominal Pain   HPI  Natasha Wolfe is a 47 y.o. female   Past medical history of no significant past medical history presents emergency department with left upper quadrant pain starting last night.  She thought was a muscle strain.  However it continued through the night.  Nonradiating.  Denies nausea or vomiting or diarrhea.  Denies GI bleeding.  Denies fever or chills.  No respiratory complaints.  History of GERD.  No NSAID use or alcohol use.    External Medical Documents Reviewed: CT in 2014 w L nephrolithiasis       Physical Exam   Triage Vital Signs: ED Triage Vitals  Enc Vitals Group     BP 08/14/22 1958 (!) 145/95     Pulse Rate 08/14/22 1958 98     Resp 08/14/22 1958 16     Temp 08/14/22 1958 98.2 F (36.8 C)     Temp Source 08/14/22 1958 Oral     SpO2 08/14/22 1958 98 %     Weight --      Height --      Head Circumference --      Peak Flow --      Pain Score 08/14/22 1959 8     Pain Loc --      Pain Edu? --      Excl. in [place]? --     Most recent vital signs: Vitals:   08/14/22 1958 08/14/22 2323  BP: (!) 145/95 (!) 143/97  Pulse: 98 88  Resp: 16 16  Temp: 98.2 F (36.8 C) 98.5 F (36.9 C)  SpO2: 98% 95%    General: Awake, no distress.  CV:  Good peripheral perfusion.  Resp:  Normal effort.  Abd:  No distention.  Other:  Epigastric/left upper quadrant tenderness to palpation.   ED Results / Procedures / Treatments   Labs (all labs ordered are listed, but only abnormal results are displayed) Labs Reviewed  LIPASE, BLOOD - Abnormal; Notable for the following components:      Result Value   Lipase 53 (*)    All other components within normal limits  COMPREHENSIVE METABOLIC PANEL - Abnormal; Notable for the following components:   Glucose, Bld 124 (*)    AST 14 (*)    All other  components within normal limits  CBC - Abnormal; Notable for the following components:   RBC 5.25 (*)    MCV 78.5 (*)    MCH 25.7 (*)    All other components within normal limits  URINALYSIS, ROUTINE W REFLEX MICROSCOPIC  TROPONIN I (HIGH SENSITIVITY)  TROPONIN I (HIGH SENSITIVITY)     I ordered and reviewed the above labs they are notable for white blood cell count is normal.  H&H is at baseline  EKG  ED ECG REPORT I, Pilar Jarvis, the attending physician, personally viewed and interpreted this ECG.   Date: 08/14/2022  EKG Time: 2003  Rate: 101  Rhythm: sinus tachycardia  Axis: nl  Intervals:none  ST&T Change: no stemi    RADIOLOGY I independently reviewed and interpreted CT scan of the abdomen pelvis see no obvious obstructive or inflammatory changes   PROCEDURES:  Critical Care performed: No  Procedures   MEDICATIONS ORDERED IN ED: Medications  alum & mag hydroxide-simeth (MAALOX/MYLANTA) 200-200-20 MG/5ML suspension 30 mL (has no  administration in time range)    And  lidocaine (XYLOCAINE) 2 % viscous mouth solution 15 mL (has no administration in time range)  famotidine (PEPCID) tablet 20 mg (has no administration in time range)  iohexol (OMNIPAQUE) 300 MG/ML solution 100 mL (100 mLs Intravenous Contrast Given 08/14/22 2347)     IMPRESSION / MDM / ASSESSMENT AND PLAN / ED COURSE  I reviewed the triage vital signs and the nursing notes.                                Patient's presentation is most consistent with acute presentation with potential threat to life or bodily function.  Differential diagnosis includes, but is not limited to, gastritis/GERD, pancreatitis, cholecystitis, intra-abdominal infection or perforation of viscus, musculoskeletal pain   The patient is on the cardiac monitor to evaluate for evidence of arrhythmia and/or significant heart rate changes.  MDM: Patient with epigastric and left upper quadrant pain history of GERD with  tenderness to palpation.  Also history of left kidney stone.  Otherwise appears nontoxic normal vital signs normal labs.  CT scan of the abdomen pelvis unrevealing for surgical pathologies.  Will give PPI for gastritis and GI referral.  She will return with any new or worsening symptoms.       FINAL CLINICAL IMPRESSION(S) / ED DIAGNOSES   Final diagnoses:  Left upper quadrant abdominal pain     Rx / DC Orders   ED Discharge Orders          Ordered    pantoprazole (PROTONIX) 40 MG tablet  Daily        08/15/22 0037    Ambulatory referral to Gastroenterology        08/15/22 0038             Note:  This document was prepared using Dragon voice recognition software and may include unintentional dictation errors.    Pilar Jarvis, MD 08/15/22 [phone]

## 2022-08-15 DIAGNOSIS — R1110 Vomiting, unspecified: Secondary | ICD-10-CM | POA: Diagnosis not present

## 2022-08-15 DIAGNOSIS — R1012 Left upper quadrant pain: Secondary | ICD-10-CM | POA: Diagnosis not present

## 2022-08-15 LAB — URINALYSIS, ROUTINE W REFLEX MICROSCOPIC
Bilirubin Urine: NEGATIVE
Glucose, UA: NEGATIVE mg/dL
Hgb urine dipstick: NEGATIVE
Ketones, ur: NEGATIVE mg/dL
Leukocytes,Ua: NEGATIVE
Nitrite: NEGATIVE
Protein, ur: NEGATIVE mg/dL
Specific Gravity, Urine: >1.046 — ABNORMAL HIGH (ref 1.005–1.030)
pH: 5 (ref 5.0–8.0)

## 2022-08-15 LAB — TROPONIN I (HIGH SENSITIVITY): Troponin I (High Sensitivity): 2 ng/L (ref <18)

## 2022-08-15 MED ORDER — FAMOTIDINE 20 MG PO TABS
20.0000 mg | ORAL_TABLET | Freq: Once | ORAL | Status: AC
  Administered 2022-08-15: 20 mg via ORAL
  Filled 2022-08-15: qty 1

## 2022-08-15 MED ORDER — PANTOPRAZOLE SODIUM 40 MG PO TBEC: 40.0000 mg | DELAYED_RELEASE_TABLET | Freq: Every day | ORAL | 1 refills | Status: AC

## 2022-08-15 MED ORDER — ALUM & MAG HYDROXIDE-SIMETH 200-200-20 MG/5ML PO SUSP
30.0000 mL | Freq: Once | ORAL | Status: AC
Start: 2022-08-15 — End: 2022-08-15
  Administered 2022-08-15: 30 mL via ORAL
  Filled 2022-08-15: qty 30

## 2022-08-15 MED ORDER — LIDOCAINE VISCOUS HCL 2 % MT SOLN
15.0000 mL | Freq: Once | OROMUCOSAL | Status: AC
  Administered 2022-08-15: 15 mL via ORAL
  Filled 2022-08-15: qty 15

## 2022-08-15 NOTE — Discharge Instructions (Signed)
Take Protonix acid reducing medication as prescribed.  See your doctor for a follow-up visit this week.  I made a referral to gastroenterology for you who will give you a call for further evaluation of your abdominal pain.   If you have any new, worsening, unexpected symptoms come back to the emergency department for recheck.
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
